# Patient Record
Sex: Female | Born: 1965 | Race: Black or African American | Hispanic: No | Marital: Married
Health system: Southern US, Community
[De-identification: ages and names within clinical notes are randomized; demographics above are authoritative.]

## PROBLEM LIST (undated history)

## (undated) DIAGNOSIS — I10 Essential (primary) hypertension: Secondary | ICD-10-CM

## (undated) DIAGNOSIS — M1990 Unspecified osteoarthritis, unspecified site: Secondary | ICD-10-CM

## (undated) DIAGNOSIS — J45909 Unspecified asthma, uncomplicated: Secondary | ICD-10-CM

## (undated) HISTORY — PX: CHOLECYSTECTOMY: SHX55

---

## 2001-03-13 ENCOUNTER — Emergency Department (HOSPITAL_COMMUNITY): Admission: EM | Admit: 2001-03-13 | Discharge: 2001-03-13 | Payer: Self-pay | Admitting: *Deleted

## 2001-06-05 ENCOUNTER — Emergency Department (HOSPITAL_COMMUNITY): Admission: EM | Admit: 2001-06-05 | Discharge: 2001-06-05 | Payer: Self-pay | Admitting: Emergency Medicine

## 2005-11-09 ENCOUNTER — Emergency Department: Payer: Self-pay | Admitting: Emergency Medicine

## 2008-07-17 ENCOUNTER — Emergency Department: Payer: Self-pay | Admitting: Emergency Medicine

## 2008-10-21 ENCOUNTER — Emergency Department: Payer: Self-pay | Admitting: Emergency Medicine

## 2009-06-22 ENCOUNTER — Emergency Department: Payer: Self-pay | Admitting: Emergency Medicine

## 2009-10-01 ENCOUNTER — Emergency Department: Payer: Self-pay | Admitting: Emergency Medicine

## 2010-09-20 ENCOUNTER — Emergency Department: Payer: Self-pay | Admitting: Emergency Medicine

## 2010-12-20 ENCOUNTER — Emergency Department: Payer: Self-pay | Admitting: Emergency Medicine

## 2011-02-28 ENCOUNTER — Emergency Department: Payer: Self-pay | Admitting: Emergency Medicine

## 2011-11-30 ENCOUNTER — Emergency Department: Payer: Self-pay | Admitting: Emergency Medicine

## 2012-02-07 ENCOUNTER — Emergency Department: Payer: Self-pay | Admitting: Emergency Medicine

## 2012-04-18 ENCOUNTER — Emergency Department: Payer: Self-pay | Admitting: Emergency Medicine

## 2012-04-18 LAB — TROPONIN I: Troponin-I: <0.02 ng/mL

## 2012-04-27 ENCOUNTER — Emergency Department: Payer: Self-pay | Admitting: Emergency Medicine

## 2013-04-02 ENCOUNTER — Emergency Department: Payer: Self-pay | Admitting: Internal Medicine

## 2013-07-13 ENCOUNTER — Emergency Department: Payer: Self-pay | Admitting: Internal Medicine

## 2013-08-26 ENCOUNTER — Emergency Department: Payer: Self-pay | Admitting: Emergency Medicine

## 2013-08-26 LAB — COMPREHENSIVE METABOLIC PANEL
Anion Gap: 3 — ABNORMAL LOW (ref 7–16)
Calcium, Total: 8.8 mg/dL (ref 8.5–10.1)
Co2: 31 mmol/L (ref 21–32)
EGFR (Non-African Amer.): >60
Osmolality: 273 (ref 275–301)
Potassium: 3.3 mmol/L — ABNORMAL LOW (ref 3.5–5.1)
SGOT(AST): 35 U/L (ref 15–37)
Sodium: 136 mmol/L (ref 136–145)
Total Protein: 7.7 g/dL (ref 6.4–8.2)

## 2013-08-26 LAB — CBC
HCT: 45.4 % (ref 35.0–47.0)
HGB: 14.8 g/dL (ref 12.0–16.0)
MCHC: 32.6 g/dL (ref 32.0–36.0)
MCV: 86 fL (ref 80–100)
Platelet: 247 10*3/uL (ref 150–440)
RDW: 13.6 % (ref 11.5–14.5)
WBC: 8.4 10*3/uL (ref 3.6–11.0)

## 2013-08-26 LAB — TROPONIN I: Troponin-I: <0.02 ng/mL

## 2013-08-26 LAB — CK TOTAL AND CKMB (NOT AT ARMC): CK-MB: 6.9 ng/mL — ABNORMAL HIGH (ref 0.5–3.6)

## 2014-02-18 ENCOUNTER — Emergency Department: Payer: Self-pay | Admitting: Emergency Medicine

## 2014-04-06 ENCOUNTER — Emergency Department: Payer: Self-pay | Admitting: Emergency Medicine

## 2014-06-09 ENCOUNTER — Emergency Department: Payer: Self-pay | Admitting: Emergency Medicine

## 2014-07-02 ENCOUNTER — Emergency Department: Payer: Self-pay | Admitting: Emergency Medicine

## 2014-10-07 ENCOUNTER — Emergency Department: Payer: Self-pay | Admitting: Emergency Medicine

## 2014-10-18 ENCOUNTER — Emergency Department: Payer: Self-pay | Admitting: Emergency Medicine

## 2014-10-18 LAB — CBC
HCT: 45.8 % (ref 35.0–47.0)
HGB: 14.7 g/dL (ref 12.0–16.0)
MCH: 28 pg (ref 26.0–34.0)
MCHC: 32.2 g/dL (ref 32.0–36.0)
MCV: 87 fL (ref 80–100)
Platelet: 250 10*3/uL (ref 150–440)
RBC: 5.26 10*6/uL — ABNORMAL HIGH (ref 3.80–5.20)
RDW: 13.5 % (ref 11.5–14.5)
WBC: 7.2 10*3/uL (ref 3.6–11.0)

## 2014-10-18 LAB — BASIC METABOLIC PANEL
Anion Gap: 6 — ABNORMAL LOW (ref 7–16)
BUN: 16 mg/dL (ref 7–18)
CALCIUM: 8.6 mg/dL (ref 8.5–10.1)
CO2: 31 mmol/L (ref 21–32)
Chloride: 104 mmol/L (ref 98–107)
Creatinine: 0.74 mg/dL (ref 0.60–1.30)
EGFR (African American): >60
EGFR (Non-African Amer.): >60
Glucose: 109 mg/dL — ABNORMAL HIGH (ref 65–99)
Osmolality: 283 (ref 275–301)
Potassium: 3.6 mmol/L (ref 3.5–5.1)
SODIUM: 141 mmol/L (ref 136–145)

## 2014-10-19 LAB — PRO B NATRIURETIC PEPTIDE: B-Type Natriuretic Peptide: 34 pg/mL (ref 0–125)

## 2014-10-19 LAB — TROPONIN I
Troponin-I: <0.02 ng/mL
Troponin-I: <0.02 ng/mL

## 2014-12-14 ENCOUNTER — Emergency Department: Payer: Self-pay | Admitting: Emergency Medicine

## 2014-12-14 LAB — URINALYSIS, COMPLETE
Bacteria: NONE SEEN
Bilirubin,UR: NEGATIVE
Glucose,UR: NEGATIVE mg/dL (ref 0–75)
KETONE: NEGATIVE
LEUKOCYTE ESTERASE: NEGATIVE
Nitrite: NEGATIVE
PH: 6 (ref 4.5–8.0)
Protein: NEGATIVE
Specific Gravity: 1.01 (ref 1.003–1.030)
Squamous Epithelial: 2

## 2014-12-14 LAB — CBC
HCT: 44.4 % (ref 35.0–47.0)
HGB: 14.3 g/dL (ref 12.0–16.0)
MCH: 27.6 pg (ref 26.0–34.0)
MCHC: 32.2 g/dL (ref 32.0–36.0)
MCV: 86 fL (ref 80–100)
Platelet: 230 10*3/uL (ref 150–440)
RBC: 5.18 10*6/uL (ref 3.80–5.20)
RDW: 14 % (ref 11.5–14.5)
WBC: 6.5 10*3/uL (ref 3.6–11.0)

## 2014-12-14 LAB — BASIC METABOLIC PANEL
Anion Gap: 3 — ABNORMAL LOW (ref 7–16)
BUN: 14 mg/dL (ref 7–18)
CALCIUM: 8.5 mg/dL (ref 8.5–10.1)
CREATININE: 0.77 mg/dL (ref 0.60–1.30)
Chloride: 105 mmol/L (ref 98–107)
Co2: 32 mmol/L (ref 21–32)
GLUCOSE: 93 mg/dL (ref 65–99)
Osmolality: 280 (ref 275–301)
Potassium: 3.9 mmol/L (ref 3.5–5.1)
Sodium: 140 mmol/L (ref 136–145)

## 2014-12-14 LAB — TROPONIN I: Troponin-I: <0.02 ng/mL

## 2014-12-31 ENCOUNTER — Emergency Department: Payer: Self-pay | Admitting: Emergency Medicine

## 2015-01-11 ENCOUNTER — Emergency Department: Payer: Self-pay | Admitting: Emergency Medicine

## 2015-03-04 ENCOUNTER — Emergency Department
Admit: 2015-03-04 | Disposition: A | Discharge status: Home / Self Care | Payer: Self-pay | Admitting: Emergency Medicine

## 2015-03-06 ENCOUNTER — Emergency Department
Admit: 2015-03-06 | Disposition: A | Discharge status: Home / Self Care | Payer: Self-pay | Admitting: Emergency Medicine

## 2015-04-14 ENCOUNTER — Encounter: Payer: Self-pay | Admitting: Emergency Medicine

## 2015-04-14 ENCOUNTER — Emergency Department
Admission: EM | Admit: 2015-04-14 | Discharge: 2015-04-14 | Disposition: A | Discharge status: Home / Self Care | Payer: BLUE CROSS/BLUE SHIELD | Attending: Emergency Medicine | Admitting: Emergency Medicine

## 2015-04-14 DIAGNOSIS — Z760 Encounter for issue of repeat prescription: Secondary | ICD-10-CM | POA: Insufficient documentation

## 2015-04-14 DIAGNOSIS — I10 Essential (primary) hypertension: Secondary | ICD-10-CM | POA: Diagnosis not present

## 2015-04-14 DIAGNOSIS — Z79899 Other long term (current) drug therapy: Secondary | ICD-10-CM | POA: Diagnosis not present

## 2015-04-14 HISTORY — DX: Essential (primary) hypertension: I10

## 2015-04-14 MED ORDER — HYDROCHLOROTHIAZIDE 12.5 MG PO CAPS: 12.5000 mg | ORAL_CAPSULE | Freq: Every day | ORAL | Status: DC
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
## 2015-04-14 NOTE — ED Notes (Signed)
Also states she would like to have her hctz refilled..had an episode of elevated b/p today

## 2015-04-14 NOTE — Discharge Instructions (Signed)
Hypertension °Hypertension, commonly called high blood pressure, is when the force of blood pumping through your arteries is too strong. Your arteries are the blood vessels that carry blood from your heart throughout your body. A blood pressure reading consists of a higher number over a lower number, such as 110/72. The higher number (systolic) is the pressure inside your arteries when your heart pumps. The lower number (diastolic) is the pressure inside your arteries when your heart relaxes. Ideally you want your blood pressure below 120/80. °Hypertension forces your heart to work harder to pump blood. Your arteries may become narrow or stiff. Having hypertension puts you at risk for heart disease, stroke, and other problems.  °RISK FACTORS °Some risk factors for high blood pressure are controllable. Others are not.  °Risk factors you cannot control include:  °· Race. You may be at higher risk if you are African American. °· Age. Risk increases with age. °· Gender. Men are at higher risk than women before age 45 years. After age 65, women are at higher risk than men. °Risk factors you can control include: °· Not getting enough exercise or physical activity. °· Being overweight. °· Getting too much fat, sugar, calories, or salt in your diet. °· Drinking too much alcohol. °SIGNS AND SYMPTOMS °Hypertension does not usually cause signs or symptoms. Extremely high blood pressure (hypertensive crisis) may cause headache, anxiety, shortness of breath, and nosebleed. °DIAGNOSIS  °To check if you have hypertension, your health care provider will measure your blood pressure while you are seated, with your arm held at the level of your heart. It should be measured at least twice using the same arm. Certain conditions can cause a difference in blood pressure between your right and left arms. A blood pressure reading that is higher than normal on one occasion does not mean that you need treatment. If one blood pressure reading  is high, ask your health care provider about having it checked again. °TREATMENT  °Treating high blood pressure includes making lifestyle changes and possibly taking medicine. Living a healthy lifestyle can help lower high blood pressure. You may need to change some of your habits. °Lifestyle changes may include: °· Following the DASH diet. This diet is high in fruits, vegetables, and whole grains. It is low in salt, red meat, and added sugars. °· Getting at least 2½ hours of brisk physical activity every week. °· Losing weight if necessary. °· Not smoking. °· Limiting alcoholic beverages. °· Learning ways to reduce stress. ° If lifestyle changes are not enough to get your blood pressure under control, your health care provider may prescribe medicine. You may need to take more than one. Work closely with your health care provider to understand the risks and benefits. °HOME CARE INSTRUCTIONS °· Have your blood pressure rechecked as directed by your health care provider.   °· Take medicines only as directed by your health care provider. Follow the directions carefully. Blood pressure medicines must be taken as prescribed. The medicine does not work as well when you skip doses. Skipping doses also puts you at risk for problems.   °· Do not smoke.   °· Monitor your blood pressure at home as directed by your health care provider.  °SEEK MEDICAL CARE IF:  °· You think you are having a reaction to medicines taken. °· You have recurrent headaches or feel dizzy. °· You have swelling in your ankles. °· You have trouble with your vision. °SEEK IMMEDIATE MEDICAL CARE IF: °· You develop a severe headache or confusion. °·   You have unusual weakness, numbness, or feel faint.  You have severe chest or abdominal pain.  You vomit repeatedly.  You have trouble breathing. MAKE SURE YOU:   Understand these instructions.  Will watch your condition.  Will get help right away if you are not doing well or get worse. Document  Released: 10/24/2005 Document Revised: 03/10/2014 Document Reviewed: 08/16/2013 [facility] Patient Information 2015 [place]. This information is not intended to replace advice given to you by your health care provider. Make sure you discuss any questions you have with your health care provider.  Take the prescription med for blood pressure as directed.  You must call your insurance company to find a provider for ongoing medical care.  Consider Dr. Darrick Huntsmanullo for your primary care needs.

## 2015-04-14 NOTE — ED Provider Notes (Signed)
[facility] Emergency Department Provider Note ____________________________________________  Time seen: 1748  I have reviewed the triage vital signs and the nursing notes.  HISTORY  Chief Complaint Hypertension  HPI Sydney Gilmore is a 49 y.o. female well-known to the ED, who reports today for complaints of elevated blood pressure. She describes that she was at the local drugstore checking her blood pressure and noted to be elevated at about 170 systolic and 90 diastolic. She also reports being out of a previously prescribed HCTZ prescription, stating that she had not been able to seek referral to her primary care provider for continuation of the medication. She is here today requesting a refill of the blood pressure medicine at this time. She denies any headache, blurred vision, dizziness, syncope or chest pain. She did report some right thigh limited to standing at work which is somewhat improved. She denies any recent injury or trauma as well.  Past Medical History  Diagnosis Date  . Hypertension    There are no active problems to display for this patient.  History reviewed. No pertinent past surgical history.  Current Outpatient Rx  Name  Route  Sig  Dispense  Refill  . hydrochlorothiazide (MICROZIDE) 12.5 MG capsule   Oral   Take 12.5 mg by mouth daily.         . hydrochlorothiazide (MICROZIDE) 12.5 MG capsule   Oral   Take 1 capsule (12.5 mg total) by mouth daily.   30 capsule   0    Allergies Review of patient's allergies indicates no known allergies.  Family History  Problem Relation Age of Onset  . Heart failure Father    Social History History  Substance Use Topics  . Smoking status: Never Smoker   . Smokeless tobacco: Not on file  . Alcohol Use: No   Review of Systems  Constitutional: Negative for fever. Eyes: Negative for visual changes. ENT: Negative for sore throat. Cardiovascular: Negative for chest pain. Elevated BP.   Respiratory: Negative for shortness of breath. Gastrointestinal: Negative for abdominal pain, vomiting and diarrhea. Genitourinary: Negative for dysuria. Musculoskeletal: Negative for back pain. Skin: Negative for rash. Neurological: Negative for headaches, focal weakness or numbness. ____________________________________________  PHYSICAL EXAM:  VITAL SIGNS: ED Triage Vitals  Enc Vitals Group     BP 04/14/15 1630 140/91 mmHg     Pulse Rate 04/14/15 1630 92     Resp 04/14/15 1630 18     Temp 04/14/15 1630 98.4 F (36.9 C)     Temp Source 04/14/15 1630 Oral     SpO2 04/14/15 1630 100 %     Weight 04/14/15 1630 232 lb (105.235 kg)     Height 04/14/15 1630 5\' 6"  (1.676 m)     Head Cir --      Peak Flow --      Pain Score 04/14/15 1630 0     Pain Loc --      Pain Edu? --      Excl. in GC? --    Constitutional: Alert and oriented. Well appearing and in no distress. Eyes: Conjunctivae are normal. PERRL. Normal extraocular movements. ENT   Head: Normocephalic and atraumatic.   Nose: No congestion/rhinnorhea.   Mouth/Throat: Mucous membranes are moist.   Neck: No stridor. Hematological/Lymphatic/Immunilogical: No cervical lymphadenopathy. Cardiovascular: Normal rate, regular rhythm.  Respiratory: Normal respiratory effort. No wheezes/rales/rhonchi. Gastrointestinal: Soft and nontender. No distention. Musculoskeletal: Nontender with normal range of motion in all extremities. No lower extremity tenderness nor edema.  Neurologic:  Normal speech and language. No gross focal neurologic deficits are appreciated. CN II-XII grossly intact. Skin:  Skin is warm, dry and intact. No rash noted. Psychiatric: Mood and affect are normal. Patient exhibits appropriate insight and judgment. ____________________________________________   INITIAL IMPRESSION / ASSESSMENT AND PLAN / ED COURSE  Essential hypertension with previous medication therapy. Patient advised that she must see  primary care with a provider approved by her newly obtained medical insurance carrier.  As a courtesy, we will provider a 30-day supply of her HCTZ. She verbalized understanding and will call the number on the back of her insurance card for advice.  Work note for tonight's second shift absence given as requested.  ____________________________________________  FINAL CLINICAL IMPRESSION(S) / ED DIAGNOSES  Final diagnoses:  Essential hypertension  Encounter for medication refill      Lissa Hoard, PA-C 04/14/15 2355  Sharyn Creamer, MD 04/14/15 2358

## 2015-04-27 ENCOUNTER — Emergency Department
Admission: EM | Admit: 2015-04-27 | Discharge: 2015-04-27 | Disposition: A | Discharge status: Home / Self Care | Payer: BLUE CROSS/BLUE SHIELD | Attending: Emergency Medicine | Admitting: Emergency Medicine

## 2015-04-27 ENCOUNTER — Emergency Department: Payer: BLUE CROSS/BLUE SHIELD

## 2015-04-27 ENCOUNTER — Encounter: Payer: Self-pay | Admitting: *Deleted

## 2015-04-27 DIAGNOSIS — M79604 Pain in right leg: Secondary | ICD-10-CM | POA: Diagnosis present

## 2015-04-27 DIAGNOSIS — Z79899 Other long term (current) drug therapy: Secondary | ICD-10-CM | POA: Insufficient documentation

## 2015-04-27 DIAGNOSIS — G5711 Meralgia paresthetica, right lower limb: Secondary | ICD-10-CM | POA: Diagnosis not present

## 2015-04-27 DIAGNOSIS — I10 Essential (primary) hypertension: Secondary | ICD-10-CM | POA: Diagnosis not present

## 2015-04-27 NOTE — ED Notes (Signed)
Right upper leg pain that began today upon awakening. Color WNL. Pt ambulatory.

## 2015-04-27 NOTE — Discharge Instructions (Signed)
No serious illness or injury suspected. I reviewed pain is either coming from muscle pain, or pinched superficial nerve called meralgia paresthetica. Wearing looser pain around the waistband. Limit standing, bending, and stooping as needed and tell symptoms resolved about 1 week. Return to the emergency department for any new or worsening condition including any leg swelling, leg rash, nor worsening pain, chest pain, shortness breath, or trouble breathing.  Pinched Nerve The term pinched nerve describes one type of damage or injury to a nerve or set of nerves. Pinched nerves can sometimes lead to other conditions. These include peripheral neuropathy, carpal tunnel syndrome, and tennis elbow. The extent of such injuries may vary from minor, temporary damage to a more permanent condition. Early diagnosis is important to prevent further damage or complications. Pinched nerve is a common cause of on-the-job injury. CAUSES  The injury may result from:  Compression.  Constriction.  Stretching. SYMPTOMS  Symptoms include:  Numbness.  "Pins and needles" or burning sensations.  Pain radiating outward from the injured area.  One of the most common examples of a single compressed nerve is the feeling of having a foot or hand "fall asleep." TREATMENT  The most often recommended treatment for pinched nerve is rest for the affected area. Corticosteroids help alleviate pain. In some cases, surgery is recommended. Physical therapy may be recommended. Splints or collars may be used. With treatment, most people recover from pinched nerve. In some cases, the damage is irreversible. Document Released: 10/14/2002 Document Revised: 01/16/2012 Document Reviewed: 10/01/2008 [facility] Patient Information 2015 [place], [place]. This information is not intended to replace advice given to you by your health care provider. Make sure you discuss any questions you have with your health care provider.

## 2015-04-27 NOTE — Progress Notes (Signed)
   04/27/15 1627  Clinical Encounter Type  Visited With Patient  Visit Type Initial  Spiritual Encounters  Spiritual Needs Prayer  Stress Factors  Patient Stress Factors Health changes   Faith tradition: [employer] Status: alert oriented sitting bedside Family: 1 daughter 2 sons neither present, all in highschool Visit Assessment: Chaplain visited the patient. She complained of right leg pain. She said that she was worried about getting a dr's note for work [employer]); She shared that she was on the job for 29 yrs and may have been hurt at work. She said it was pleasant to have the chaplain visit.  Chaplains and Pastoral Care can be reached at [phone] or by submitting an online request ([phone])

## 2015-04-27 NOTE — ED Notes (Signed)
Pt alert and oriented X4, active, cooperative, pt in NAD. RR even and unlabored, color WNL.  Pt informed to return if any life threatening symptoms occur.   

## 2015-04-27 NOTE — ED Provider Notes (Signed)
[facility] Emergency Department Provider Note   ____________________________________________  Time seen: 4:15 PM I have reviewed the triage vital signs and the triage nursing note.  HISTORY  Chief Complaint Leg Pain   Historian Patient  HPI Sydney Gilmore is a 49 y.o. female who is presenting with several days of right leg pain which is located in the anterior thigh and feels like burning. She also has some lateral and posterior thigh pain which is more squeezing. She reports she's had a superficial blood clot in a vein behind her knee. She's had no . She's had no skin rash. She's had no joint pain. She's had no chest pain or trouble breathing. She's had no family history of blood clots. She is especially concerned about a leg blood clot. She does work 7 days a week standing and bending.   Past Medical History  Diagnosis Date  . Hypertension     There are no active problems to display for this patient.   History reviewed. No pertinent past surgical history.  Current Outpatient Rx  Name  Route  Sig  Dispense  Refill  . hydrochlorothiazide (MICROZIDE) 12.5 MG capsule   Oral   Take 12.5 mg by mouth daily.         . hydrochlorothiazide (MICROZIDE) 12.5 MG capsule   Oral   Take 1 capsule (12.5 mg total) by mouth daily.   30 capsule   0     Allergies Review of patient's allergies indicates no known allergies.  Family History  Problem Relation Age of Onset  . Heart failure Father     Social History History  Substance Use Topics  . Smoking status: Never Smoker   . Smokeless tobacco: Not on file  . Alcohol Use: No    Review of Systems  Constitutional: Negative for fever. Eyes: Negative for visual changes. ENT: Negative for sore throat. Cardiovascular: Negative for chest pain. Respiratory: Negative for shortness of breath. Gastrointestinal: Negative for abdominal pain, vomiting and diarrhea. Genitourinary: Negative for  dysuria. Musculoskeletal: Negative for back pain. Skin: Negative for rash. Neurological: Negative for headaches, focal weakness or numbness.  ____________________________________________   PHYSICAL EXAM:  VITAL SIGNS: ED Triage Vitals  Enc Vitals Group     BP 04/27/15 1559 123/90 mmHg     Pulse Rate 04/27/15 1557 90     Resp 04/27/15 1557 20     Temp 04/27/15 1557 98.1 F (36.7 C)     Temp Source 04/27/15 1557 Oral     SpO2 04/27/15 1557 98 %     Weight 04/27/15 1557 232 lb (105.235 kg)     Height 04/27/15 1557 5\' 6"  (1.676 m)     Head Cir --      Peak Flow --      Pain Score 04/27/15 1555 7     Pain Loc --      Pain Edu? --      Excl. in GC? --      Constitutional: Alert and oriented. Well appearing and in no distress. Eyes: Conjunctivae are normal. PERRL. Normal extraocular movements. ENT   Head: Normocephalic and atraumatic.   Nose: No congestion/rhinnorhea.   Mouth/Throat: Mucous membranes are moist.   Neck: No stridor. Cardiovascular: Normal rate, regular rhythm.  No murmurs, rubs, or gallops. Respiratory: Normal respiratory effort without tachypnea nor retractions. Breath sounds are clear and equal bilaterally. No wheezes/rales/rhonchi. Gastrointestinal: Soft. No distention, no guarding, no rebound. Nontender  Genitourinary/rectal: Deferred Musculoskeletal: Nontender with normal range  of motion in all extremities. Reports tenderness or paresthesias over the anterolateral portion of the right thigh. Reports mild tenderness to palpation of the lateral posterior thigh. No joint effusions.  No edema edema. Neurologic:  Normal speech and language. No gross focal neurologic deficits are appreciated. Skin:  Skin is warm, dry and intact. No rash noted. Psychiatric: Mood and affect are normal. Speech and behavior are normal. Patient exhibits appropriate insight and judgment.  ____________________________________________   EKG I, Governor Rooks, MD, the  attending physician have personally viewed and interpreted all ECGs.  None ____________________________________________  LABS (pertinent positives/negatives)  None  ____________________________________________  RADIOLOGY Imaging viewed by me, and interpreted by Radiologist   Ultrasound right lower extremity: No DVT __________________________________________  PROCEDURES  Procedure(s) performed: None Critical Care performed: None  ____________________________________________   ED COURSE / ASSESSMENT AND PLAN  Pertinent labs & imaging results that were available during my care of the patient were reviewed by me and considered in my medical decision making (see chart for details).  Symptoms most consistent with either musculoskeletal pain of the right thigh or IT band, and or meralgia paresthetica of the right side. I discussed this with the patient. She is extremely concerned about possibly of a DVT, and with some posterior discomfort including in the back of the knee, I will obtain an ultrasound to rule out any DVT.    I discussed return precautions, follow-up instructions, and discharged instructions with patient and/or family.  ___________________________________________   FINAL CLINICAL IMPRESSION(S) / ED DIAGNOSES   Final diagnoses:  Meralgia paraesthetica, right      Governor Rooks, MD 04/27/15 [phone]

## 2015-05-23 ENCOUNTER — Emergency Department
Admission: EM | Admit: 2015-05-23 | Discharge: 2015-05-23 | Disposition: A | Discharge status: Home / Self Care | Payer: BLUE CROSS/BLUE SHIELD | Attending: Emergency Medicine | Admitting: Emergency Medicine

## 2015-05-23 ENCOUNTER — Encounter: Payer: Self-pay | Admitting: Emergency Medicine

## 2015-05-23 DIAGNOSIS — R202 Paresthesia of skin: Secondary | ICD-10-CM | POA: Insufficient documentation

## 2015-05-23 DIAGNOSIS — Z79899 Other long term (current) drug therapy: Secondary | ICD-10-CM | POA: Insufficient documentation

## 2015-05-23 DIAGNOSIS — I10 Essential (primary) hypertension: Secondary | ICD-10-CM | POA: Insufficient documentation

## 2015-05-23 DIAGNOSIS — M79604 Pain in right leg: Secondary | ICD-10-CM | POA: Diagnosis present

## 2015-05-23 MED ORDER — MELOXICAM 15 MG PO TABS: 15.0000 mg | ORAL_TABLET | Freq: Every day | ORAL | Status: DC

## 2015-05-23 MED ORDER — PREDNISONE 10 MG (21) PO TBPK: ORAL_TABLET | ORAL | Status: DC

## 2015-05-23 NOTE — ED Notes (Signed)
States burning thigh pain x 1 month, low back pain intermittent, bilateral pedal edema however equal and states this is usual for her.

## 2015-05-23 NOTE — ED Provider Notes (Signed)
[facility] Emergency Department Provider Note ____________________________________________  Time seen: Approximately 5:06 PM  I have reviewed the triage vital signs and the nursing notes.   HISTORY  Chief Complaint Leg Pain   HPI Sydney Gilmore is a 49 y.o. female who presents to the emergency department for sudden onset of numbness and burning in the right thigh that has been present for approximately one month. She states that she works to 7 days a week and walks on concrete floors for many hours during the day. She was evaluated for the same pain on 04/27/2015 and ruled out for DVT. She has not improved with BC powders, ibuprofen, or Tylenol.  Past Medical History  Diagnosis Date  . Hypertension     There are no active problems to display for this patient.   Past Surgical History  Procedure Laterality Date  . Cholecystectomy      Current Outpatient Rx  Name  Route  Sig  Dispense  Refill  . hydrochlorothiazide (MICROZIDE) 12.5 MG capsule   Oral   Take 12.5 mg by mouth daily.         . hydrochlorothiazide (MICROZIDE) 12.5 MG capsule   Oral   Take 1 capsule (12.5 mg total) by mouth daily.   30 capsule   0   . meloxicam (MOBIC) 15 MG tablet   Oral   Take 1 tablet (15 mg total) by mouth daily.   30 tablet   2   . predniSONE (STERAPRED UNI-PAK 21 TAB) 10 MG (21) TBPK tablet      Take 6 tablets on day 1 Take 5 tablets on day 2 Take 4 tablets on day 3 Take 3 tablets on day 4 Take 2 tablets on day 5 Take 1 tablet on day 6   21 tablet   0     Allergies Review of patient's allergies indicates no known allergies.  Family History  Problem Relation Age of Onset  . Heart failure Father     Social History History  Substance Use Topics  . Smoking status: Never Smoker   . Smokeless tobacco: Not on file  . Alcohol Use: No    Review of Systems Constitutional: No recent illness. Eyes: No visual changes. ENT: No sore  throat. Cardiovascular: Denies chest pain or palpitations. Respiratory: Denies shortness of breath. Gastrointestinal: No abdominal pain.  Genitourinary: Negative for dysuria. Musculoskeletal: Pain in right lateral and anterior thigh and transverse lumbar Skin: Negative for rash. Neurological: Negative for headaches, focal weakness or numbness. 10-point ROS otherwise negative.  ____________________________________________   PHYSICAL EXAM:  VITAL SIGNS: ED Triage Vitals  Enc Vitals Group     BP 05/23/15 1641 140/81 mmHg     Pulse Rate 05/23/15 1641 92     Resp 05/23/15 1641 18     Temp 05/23/15 1641 98.4 F (36.9 C)     Temp Source 05/23/15 1641 Oral     SpO2 05/23/15 1641 97 %     Weight 05/23/15 1641 232 lb (105.235 kg)     Height 05/23/15 1641 5\' 6"  (1.676 m)     Head Cir --      Peak Flow --      Pain Score 05/23/15 1642 8     Pain Loc --      Pain Edu? --      Excl. in GC? --     Constitutional: Alert and oriented. Well appearing and in no acute distress. Eyes: Conjunctivae are normal. EOMI. Head: Atraumatic.  Nose: No congestion/rhinnorhea. Neck: No stridor.  Respiratory: Normal respiratory effort.   Musculoskeletal: Full range of motion of the right lower extremity including right hip. No pain with external or internal rotation. Ambulatory without assistance. Neurologic:  Normal speech and language. No gross focal neurologic deficits are appreciated. Speech is normal. No gait instability. Skin:  Skin is warm, dry and intact. Atraumatic. No contusions or lesions present over the area of numbness. Psychiatric: Mood and affect are normal. Speech and behavior are normal.  ____________________________________________   LABS (all labs ordered are listed, but only abnormal results are displayed)  Labs Reviewed - No data to display ____________________________________________  RADIOLOGY   ____________________________________________   PROCEDURES  Procedure(s)  performed: None   ____________________________________________   INITIAL IMPRESSION / ASSESSMENT AND PLAN / ED COURSE  Pertinent labs & imaging results that were available during my care of the patient were reviewed by me and considered in my medical decision making (see chart for details).  Due to the length of paresthesia will try a seven-day course of prednisone in tapering dose and prescribe meloxicam. Patient was strongly advised to establish a primary care provider and have a physical including fasting labs. She was advised to return to the emergency department for any shortness of breath or pain that extends to the calf or hip. ____________________________________________   FINAL CLINICAL IMPRESSION(S) / ED DIAGNOSES  Final diagnoses:  Paresthesia of right leg       Chinita Pester, FNP 05/23/15 1728  Loleta Rose, MD 05/23/15 2207

## 2015-05-23 NOTE — Discharge Instructions (Signed)
Patient was advised to follow up with Primary Care for symptoms that are not improving over the next 7 days. Also advised to return to the ER for symptoms that change or worsen if unable to schedule an appointment.

## 2015-05-25 ENCOUNTER — Encounter: Payer: Self-pay | Admitting: Emergency Medicine

## 2015-05-25 ENCOUNTER — Emergency Department
Admission: EM | Admit: 2015-05-25 | Discharge: 2015-05-25 | Disposition: A | Discharge status: Home / Self Care | Payer: BLUE CROSS/BLUE SHIELD | Attending: Emergency Medicine | Admitting: Emergency Medicine

## 2015-05-25 DIAGNOSIS — R202 Paresthesia of skin: Secondary | ICD-10-CM | POA: Insufficient documentation

## 2015-05-25 DIAGNOSIS — Z79899 Other long term (current) drug therapy: Secondary | ICD-10-CM | POA: Diagnosis not present

## 2015-05-25 DIAGNOSIS — I10 Essential (primary) hypertension: Secondary | ICD-10-CM | POA: Diagnosis not present

## 2015-05-25 DIAGNOSIS — Z760 Encounter for issue of repeat prescription: Secondary | ICD-10-CM | POA: Diagnosis not present

## 2015-05-25 DIAGNOSIS — M25551 Pain in right hip: Secondary | ICD-10-CM | POA: Diagnosis present

## 2015-05-25 MED ORDER — HYDROCHLOROTHIAZIDE 12.5 MG PO CAPS
12.5000 mg | ORAL_CAPSULE | Freq: Once | ORAL | Status: AC
  Administered 2015-05-25: 12.5 mg via ORAL
  Filled 2015-05-25: qty 1

## 2015-05-25 MED ORDER — HYDROCHLOROTHIAZIDE 12.5 MG PO CAPS: 12.5000 mg | ORAL_CAPSULE | Freq: Every day | ORAL | Status: DC

## 2015-05-25 NOTE — ED Notes (Signed)
Pt was seen here on Saturday for right hip pain, dx with sciatica and given meds and day off from work. Pt wants rechecked and one more day out of work. No acute distress noted.

## 2015-05-25 NOTE — ED Notes (Signed)
Assessment per PA 

## 2015-05-25 NOTE — ED Provider Notes (Signed)
[facility] Emergency Department Provider Note  ____________________________________________  Time seen: Approximately 4:57 PM  I have reviewed the triage vital signs and the nursing notes.   HISTORY  Chief Complaint Leg Pain    HPI Sydney Gilmore is a 49 y.o. female patient here for reevaluation and return back to work secondary to being seen for right hip pain 2 days ago. Patient was diagnosed with sciatica and given prednisone dosepak along was meloxicam.Patient state the decreased activity has decreased her pain. Patient is rating the pain as a 6/10. Patient states she is out of her blood pressure medication when she was questioned about her elevated reading today. It was noticed on the last 2 previous visit her blood pressure was controlled.   Past Medical History  Diagnosis Date  . Hypertension     There are no active problems to display for this patient.   Past Surgical History  Procedure Laterality Date  . Cholecystectomy      Current Outpatient Rx  Name  Route  Sig  Dispense  Refill  . hydrochlorothiazide (MICROZIDE) 12.5 MG capsule   Oral   Take 12.5 mg by mouth daily.         . hydrochlorothiazide (MICROZIDE) 12.5 MG capsule   Oral   Take 1 capsule (12.5 mg total) by mouth daily.   30 capsule   0   . hydrochlorothiazide (MICROZIDE) 12.5 MG capsule   Oral   Take 1 capsule (12.5 mg total) by mouth daily.   30 capsule   11   . meloxicam (MOBIC) 15 MG tablet   Oral   Take 1 tablet (15 mg total) by mouth daily.   30 tablet   2   . predniSONE (STERAPRED UNI-PAK 21 TAB) 10 MG (21) TBPK tablet      Take 6 tablets on day 1 Take 5 tablets on day 2 Take 4 tablets on day 3 Take 3 tablets on day 4 Take 2 tablets on day 5 Take 1 tablet on day 6   21 tablet   0     Allergies Review of patient's allergies indicates no known allergies.  Family History  Problem Relation Age of Onset  . Heart failure Father     Social  History History  Substance Use Topics  . Smoking status: Never Smoker   . Smokeless tobacco: Not on file  . Alcohol Use: No    Review of Systems Constitutional: No fever/chills Eyes: No visual changes. ENT: No sore throat. Cardiovascular: Denies chest pain. Respiratory: Denies shortness of breath. Gastrointestinal: No abdominal pain.  No nausea, no vomiting.  No diarrhea.  No constipation. Genitourinary: Right hip pain Musculoskeletal: Negative for back pain. Skin: Negative for rash. Neurological: Negative for headaches, focal weakness or numbness. Endocrine: Hematological/Lymphatic: Allergic/Immunilogical: **} 10-point ROS otherwise negative.  ____________________________________________   PHYSICAL EXAM:  VITAL SIGNS: ED Triage Vitals  Enc Vitals Group     BP 05/25/15 1640 182/102 mmHg     Pulse Rate 05/25/15 1640 98     Resp 05/25/15 1640 18     Temp 05/25/15 1640 98.5 F (36.9 C)     Temp Source 05/25/15 1640 Oral     SpO2 05/25/15 1640 96 %     Weight 05/25/15 1640 236 lb (107.049 kg)     Height 05/25/15 1640 5\' 6"  (1.676 m)     Head Cir --      Peak Flow --      Pain Score 05/25/15  1641 6     Pain Loc --      Pain Edu? --      Excl. in GC? --     Constitutional: Alert and oriented. Well appearing and in no acute distress. Eyes: Conjunctivae are normal. PERRL. EOMI. Head: Atraumatic. Nose: No congestion/rhinnorhea. Mouth/Throat: Mucous membranes are moist.  Oropharynx non-erythematous. Neck: No stridor.  No cervical spine tenderness to palpation. Hematological/Lymphatic/Immunilogical: No cervical lymphadenopathy. Cardiovascular: Normal rate, regular rhythm. Grossly normal heart sounds.  Good peripheral circulation. Elevated blood pressure. Respiratory: Normal respiratory effort.  No retractions. Lungs CTAB. Gastrointestinal: Soft and nontender. No distention. No abdominal bruits. No CVA tenderness. Musculoskeletal: No deformity of the right hip there  is still some mild guarding palpation at greater trochanter. There is no guarding with external and internal rotation of the hip. Patient is ambulating normal gait.  Neurologic:  Normal speech and language. No gross focal neurologic deficits are appreciated. No gait instability. Skin:  Skin is warm, dry and intact. No rash noted. Psychiatric: Mood and affect are normal. Speech and behavior are normal.  ____________________________________________   LABS (all labs ordered are listed, but only abnormal results are displayed)  Labs Reviewed - No data to display ____________________________________________  EKG   ____________________________________________  RADIOLOGY   ____________________________________________   PROCEDURES  Procedure(s) performed: None  Critical Care performed: No  ____________________________________________   INITIAL IMPRESSION / ASSESSMENT AND PLAN / ED COURSE  Pertinent labs & imaging results that were available during my care of the patient were reviewed by me and considered in my medical decision making (see chart for details). Resolving paresthesia and myalgia to the right lower extremity. Due to the patient's elevated blood pressure she was given 12.5 mg hydrochlorothiazide. Patient advised to continue her previous medications. Patient given a return to work for tomorrow. Patient advised follow her PCP. Patient advised to return to ER for condition worsens. ____________________________________________   FINAL CLINICAL IMPRESSION(S) / ED DIAGNOSES  Final diagnoses:  Right leg paresthesias  Medication refill      Joni Reining, PA-C 05/25/15 1719  Emily Filbert, MD 05/26/15 [phone]

## 2015-07-01 ENCOUNTER — Emergency Department
Admission: EM | Admit: 2015-07-01 | Discharge: 2015-07-01 | Disposition: A | Discharge status: Home / Self Care | Payer: BLUE CROSS/BLUE SHIELD | Attending: Emergency Medicine | Admitting: Emergency Medicine

## 2015-07-01 ENCOUNTER — Encounter: Payer: Self-pay | Admitting: Emergency Medicine

## 2015-07-01 DIAGNOSIS — S76911D Strain of unspecified muscles, fascia and tendons at thigh level, right thigh, subsequent encounter: Secondary | ICD-10-CM

## 2015-07-01 DIAGNOSIS — X58XXXD Exposure to other specified factors, subsequent encounter: Secondary | ICD-10-CM | POA: Diagnosis not present

## 2015-07-01 DIAGNOSIS — S79921D Unspecified injury of right thigh, subsequent encounter: Secondary | ICD-10-CM | POA: Diagnosis present

## 2015-07-01 DIAGNOSIS — I10 Essential (primary) hypertension: Secondary | ICD-10-CM | POA: Insufficient documentation

## 2015-07-01 MED ORDER — NAPROXEN 500 MG PO TBEC: 500.0000 mg | DELAYED_RELEASE_TABLET | Freq: Two times a day (BID) | ORAL | Status: DC

## 2015-07-01 MED ORDER — NAPROXEN 500 MG PO TABS
500.0000 mg | ORAL_TABLET | Freq: Once | ORAL | Status: AC
  Administered 2015-07-01: 500 mg via ORAL
  Filled 2015-07-01: qty 1

## 2015-07-01 MED ORDER — HYDROCHLOROTHIAZIDE 12.5 MG PO CAPS: 12.5000 mg | ORAL_CAPSULE | Freq: Every day | ORAL | Status: DC

## 2015-07-01 NOTE — ED Notes (Signed)
Having pain to right knee and leg  states this has been going on for about 2-3 months   Unsure of injury.Marland Kitchen

## 2015-07-01 NOTE — Discharge Instructions (Signed)
Hip Pain Your hip is the joint between your upper legs and your lower pelvis. The bones, cartilage, tendons, and muscles of your hip joint perform a lot of work each day supporting your body weight and allowing you to move around. Hip pain can range from a minor ache to severe pain in one or both of your hips. Pain may be felt on the inside of the hip joint near the groin, or the outside near the buttocks and upper thigh. You may have swelling or stiffness as well.  HOME CARE INSTRUCTIONS   Take medicines only as directed by your health care provider.  Apply ice to the injured area:  Put ice in a plastic bag.  Place a towel between your skin and the bag.  Leave the ice on for 15-20 minutes at a time, 3-4 times a day.  Keep your leg raised (elevated) when possible to lessen swelling.  Avoid activities that cause pain.  Follow specific exercises as directed by your health care provider.  Sleep with a pillow between your legs on your most comfortable side.  Record how often you have hip pain, the location of the pain, and what it feels like. SEEK MEDICAL CARE IF:   You are unable to put weight on your leg.  Your hip is red or swollen or very tender to touch.  Your pain or swelling continues or worsens after 1 week.  You have increasing difficulty walking.  You have a fever. SEEK IMMEDIATE MEDICAL CARE IF:   You have fallen.  You have a sudden increase in pain and swelling in your hip. MAKE SURE YOU:   Understand these instructions.  Will watch your condition.  Will get help right away if you are not doing well or get worse. Document Released: 04/13/2010 Document Revised: 03/10/2014 Document Reviewed: 06/20/2013 [facility] Patient Information 2015 [place], [place]. This information is not intended to replace advice given to you by your health care provider. Make sure you discuss any questions you have with your health care provider.  Muscle Strain A muscle strain (pulled  muscle) happens when a muscle is stretched beyond normal length. It happens when a sudden, violent force stretches your muscle too far. Usually, a few of the fibers in your muscle are torn. Muscle strain is common in athletes. Recovery usually takes 1-2 weeks. Complete healing takes 5-6 weeks.  HOME CARE   Follow the PRICE method of treatment to help your injury get better. Do this the first 2-3 days after the injury:  Protect. Protect the muscle to keep it from getting injured again.  Rest. Limit your activity and rest the injured body part.  Ice. Put ice in a plastic bag. Place a towel between your skin and the bag. Then, apply the ice and leave it on from 15-20 minutes each hour. After the third day, switch to moist heat packs.  Compression. Use a splint or elastic bandage on the injured area for comfort. Do not put it on too tightly.  Elevate. Keep the injured body part above the level of your heart.  Only take medicine as told by your doctor.  Warm up before doing exercise to prevent future muscle strains. GET HELP IF:   You have more pain or puffiness (swelling) in the injured area.  You feel numbness, tingling, or notice a loss of strength in the injured area. MAKE SURE YOU:   Understand these instructions.  Will watch your condition.  Will get help right away if you  are not doing well or get worse. Document Released: 08/02/2008 Document Revised: 08/14/2013 Document Reviewed: 05/23/2013 [facility] Patient Information 2015 [place], [place]. This information is not intended to replace advice given to you by your health care provider. Make sure you discuss any questions you have with your health care provider.  Take the prescription medicine as directed. Follow-up with your provider as scheduled.

## 2015-07-01 NOTE — ED Provider Notes (Signed)
[facility] Emergency Department Provider Note ____________________________________________  Time seen: 1735  I have reviewed the triage vital signs and the nursing notes.  HISTORY  Chief Complaint  Leg Pain  HPI Sydney Gilmore is a 49 y.o. female reports to the ED for evaluation and treatment of pain to the lateral right thigh for the last 2-3 months. She denies any recent injury or trauma. She was seen here previously for the same complaint, and notes a normal x-ray. She gives a history of arthritis, but denies any problems with her low back including herniated disc, prior surgeries, or previous back problems. She has not been taking any medication for her pain, claiming that she has no medication at home. She is scheduled to see her new selective primary care provider tomorrow for routine medical evaluation.  Past Medical History  Diagnosis Date  . Hypertension    There are no active problems to display for this patient.  History reviewed. No pertinent past surgical history.  Current Outpatient Rx  Name  Route  Sig  Dispense  Refill  . hydrochlorothiazide (HYDRODIURIL) 12.5 MG tablet   Oral   Take 12.5 mg by mouth daily.         . naproxen (EC NAPROSYN) 500 MG EC tablet   Oral   Take 1 tablet (500 mg total) by mouth 2 (two) times daily with a meal.   30 tablet   0    Allergies Review of patient's allergies indicates no known allergies.  No family history on file.  Social History Social History  Substance Use Topics  . Smoking status: Never Smoker   . Smokeless tobacco: None  . Alcohol Use: No   Review of Systems  Constitutional: Negative for fever. Eyes: Negative for visual changes. ENT: Negative for sore throat. Cardiovascular: Negative for chest pain. Respiratory: Negative for shortness of breath. Gastrointestinal: Negative for abdominal pain, vomiting and diarrhea. Genitourinary: Negative for dysuria. Musculoskeletal: Negative  for back pain. Right thigh pain as above Skin: Negative for rash. Neurological: Negative for headaches, focal weakness or numbness. ____________________________________________  PHYSICAL EXAM:  VITAL SIGNS: ED Triage Vitals  Enc Vitals Group     BP --      Pulse --      Resp --      Temp --      Temp src --      SpO2 --      Weight --      Height --      Head Cir --      Peak Flow --      Pain Score 07/01/15 1710 8     Pain Loc --      Pain Edu? --      Excl. in GC? --    Constitutional: Alert and oriented. Well appearing and in no distress. Eyes: Conjunctivae are normal. PERRL. Normal extraocular movements. ENT   Head: Normocephalic and atraumatic.   Nose: No congestion/rhinnorhea.   Mouth/Throat: Mucous membranes are moist.   Neck: Supple. No thyromegaly. Hematological/Lymphatic/Immunilogical: No cervical lymphadenopathy. Cardiovascular: Normal rate, regular rhythm.  Respiratory: Normal respiratory effort. No wheezes/rales/rhonchi. Gastrointestinal: Soft and nontender. No distention. Musculoskeletal: Nontender with normal range of motion in all extremities. Right hip & knee with normal ROM. Normal toe/heel raise. Spinal exam without deformity, spasm, or step-off. Normal lumbar flexion and extension ROM.  Neurologic: CN II-XII grossly intact. Normal LE DTRs bilaterally. Normal gait without ataxia. Normal speech and language. No gross focal neurologic  deficits are appreciated. Skin:  Skin is warm, dry and intact. No rash noted. Psychiatric: Mood and affect are normal. Patient exhibits appropriate insight and judgment. ____________________________________________  PROCEDURES  Naprosyn 500 mg PO ____________________________________________  INITIAL IMPRESSION / ASSESSMENT AND PLAN / ED COURSE  Right thigh strain. Suggest follow-up with primary provider for further care & management. Prescription for EC Naprosyn provided.   ____________________________________________  FINAL CLINICAL IMPRESSION(S) / ED DIAGNOSES  Final diagnoses:  Muscle strain of thigh, right, subsequent encounter     Lissa Hoard, PA-C 07/01/15 1800  Myrna Blazer, MD 07/01/15 2034

## 2015-07-01 NOTE — ED Notes (Signed)
Has been seen for same in past .conts to have pain and states she has an appt for f/u tomorrow. Pain increased with standing

## 2015-07-02 ENCOUNTER — Encounter: Payer: Self-pay | Admitting: Emergency Medicine

## 2015-08-03 ENCOUNTER — Emergency Department
Admission: EM | Admit: 2015-08-03 | Discharge: 2015-08-03 | Disposition: A | Discharge status: Home / Self Care | Payer: BLUE CROSS/BLUE SHIELD | Attending: Student | Admitting: Student

## 2015-08-03 ENCOUNTER — Encounter: Payer: Self-pay | Admitting: Emergency Medicine

## 2015-08-03 DIAGNOSIS — Y998 Other external cause status: Secondary | ICD-10-CM | POA: Diagnosis not present

## 2015-08-03 DIAGNOSIS — S199XXA Unspecified injury of neck, initial encounter: Secondary | ICD-10-CM | POA: Diagnosis not present

## 2015-08-03 DIAGNOSIS — Z79899 Other long term (current) drug therapy: Secondary | ICD-10-CM | POA: Diagnosis not present

## 2015-08-03 DIAGNOSIS — I10 Essential (primary) hypertension: Secondary | ICD-10-CM | POA: Insufficient documentation

## 2015-08-03 DIAGNOSIS — Y9389 Activity, other specified: Secondary | ICD-10-CM | POA: Insufficient documentation

## 2015-08-03 DIAGNOSIS — Y92410 Unspecified street and highway as the place of occurrence of the external cause: Secondary | ICD-10-CM | POA: Diagnosis not present

## 2015-08-03 DIAGNOSIS — Z791 Long term (current) use of non-steroidal anti-inflammatories (NSAID): Secondary | ICD-10-CM | POA: Insufficient documentation

## 2015-08-03 DIAGNOSIS — S3992XA Unspecified injury of lower back, initial encounter: Secondary | ICD-10-CM | POA: Insufficient documentation

## 2015-08-03 DIAGNOSIS — S4992XA Unspecified injury of left shoulder and upper arm, initial encounter: Secondary | ICD-10-CM | POA: Insufficient documentation

## 2015-08-03 DIAGNOSIS — M7918 Myalgia, other site: Secondary | ICD-10-CM

## 2015-08-03 MED ORDER — CYCLOBENZAPRINE HCL 10 MG PO TABS: 10.0000 mg | ORAL_TABLET | Freq: Three times a day (TID) | ORAL | Status: DC | PRN
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
## 2015-08-11 ENCOUNTER — Emergency Department
Admission: EM | Admit: 2015-08-11 | Discharge: 2015-08-11 | Disposition: A | Discharge status: Home / Self Care | Payer: BLUE CROSS/BLUE SHIELD | Attending: Student | Admitting: Student

## 2015-08-11 ENCOUNTER — Emergency Department: Payer: BLUE CROSS/BLUE SHIELD

## 2015-08-11 ENCOUNTER — Encounter: Payer: Self-pay | Admitting: Intensive Care

## 2015-08-11 DIAGNOSIS — S56912D Strain of unspecified muscles, fascia and tendons at forearm level, left arm, subsequent encounter: Secondary | ICD-10-CM | POA: Insufficient documentation

## 2015-08-11 DIAGNOSIS — I10 Essential (primary) hypertension: Secondary | ICD-10-CM | POA: Insufficient documentation

## 2015-08-11 DIAGNOSIS — S46912D Strain of unspecified muscle, fascia and tendon at shoulder and upper arm level, left arm, subsequent encounter: Secondary | ICD-10-CM | POA: Insufficient documentation

## 2015-08-11 DIAGNOSIS — S59912D Unspecified injury of left forearm, subsequent encounter: Secondary | ICD-10-CM | POA: Diagnosis present

## 2015-08-11 MED ORDER — HYDROCHLOROTHIAZIDE 12.5 MG PO CAPS: 12.5000 mg | ORAL_CAPSULE | Freq: Every day | ORAL | Status: AC

## 2015-08-11 MED ORDER — NAPROXEN 500 MG PO TABS: 500.0000 mg | ORAL_TABLET | Freq: Two times a day (BID) | ORAL | Status: DC

## 2015-08-11 MED ORDER — NAPROXEN 500 MG PO TABS
ORAL_TABLET | ORAL | Status: AC
  Filled 2015-08-11: qty 1

## 2015-08-11 MED ORDER — NAPROXEN 500 MG PO TABS
500.0000 mg | ORAL_TABLET | Freq: Once | ORAL | Status: AC
  Administered 2015-08-11: 500 mg via ORAL
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
## 2015-12-29 ENCOUNTER — Encounter: Payer: Self-pay | Admitting: Emergency Medicine

## 2015-12-29 ENCOUNTER — Emergency Department
Admission: EM | Admit: 2015-12-29 | Discharge: 2015-12-29 | Disposition: A | Discharge status: Home / Self Care | Payer: BLUE CROSS/BLUE SHIELD | Attending: Emergency Medicine | Admitting: Emergency Medicine

## 2015-12-29 DIAGNOSIS — J45909 Unspecified asthma, uncomplicated: Secondary | ICD-10-CM | POA: Insufficient documentation

## 2015-12-29 DIAGNOSIS — J069 Acute upper respiratory infection, unspecified: Secondary | ICD-10-CM | POA: Insufficient documentation

## 2015-12-29 DIAGNOSIS — J029 Acute pharyngitis, unspecified: Secondary | ICD-10-CM

## 2015-12-29 DIAGNOSIS — Z79899 Other long term (current) drug therapy: Secondary | ICD-10-CM | POA: Insufficient documentation

## 2015-12-29 DIAGNOSIS — I10 Essential (primary) hypertension: Secondary | ICD-10-CM | POA: Insufficient documentation

## 2015-12-29 DIAGNOSIS — M940 Chondrocostal junction syndrome [Tietze]: Secondary | ICD-10-CM

## 2015-12-29 HISTORY — DX: Unspecified asthma, uncomplicated: J45.909

## 2015-12-29 LAB — POCT RAPID STREP A: Streptococcus, Group A Screen (Direct): NEGATIVE

## 2015-12-29 MED ORDER — IBUPROFEN 600 MG PO TABS: 600.0000 mg | ORAL_TABLET | Freq: Three times a day (TID) | ORAL | Status: DC | PRN

## 2015-12-29 MED ORDER — LIDOCAINE VISCOUS 2 % MT SOLN: 5.0000 mL | Freq: Four times a day (QID) | OROMUCOSAL | Status: DC | PRN

## 2015-12-29 MED ORDER — HYDROCOD POLST-CPM POLST ER 10-8 MG/5ML PO SUER: 5.0000 mL | Freq: Two times a day (BID) | ORAL | Status: DC

## 2015-12-29 NOTE — Discharge Instructions (Signed)
Costochondritis  Costochondritis is a condition in which the tissue (cartilage) that connects your ribs with your breastbone (sternum) becomes irritated. It causes pain in the chest and rib area. It usually goes away on its own over time.  HOME CARE  · Avoid activities that wear you out.  · Do not strain your ribs. Avoid activities that use your:    Chest.    Belly.    Side muscles.  · Put ice on the area for the first 2 days after the pain starts.    Put ice in a plastic bag.    Place a towel between your skin and the bag.    Leave the ice on for 20 minutes, 2-3 times a day.  · Only take medicine as told by your doctor.  GET HELP IF:  · You have redness or puffiness (swelling) in the rib area.  · Your pain does not go away with rest or medicine.  GET HELP RIGHT AWAY IF:   · Your pain gets worse.  · You are very uncomfortable.  · You have trouble breathing.  · You cough up blood.  · You start sweating or throwing up (vomiting).  · You have a fever or lasting symptoms for more than 2-3 days.  · You have a fever and your symptoms suddenly get worse.  MAKE SURE YOU:   · Understand these instructions.  · Will watch your condition.  · Will get help right away if you are not doing well or get worse.     This information is not intended to replace advice given to you by your health care provider. Make sure you discuss any questions you have with your health care provider.     Document Released: 04/11/2008 Document Revised: 06/26/2013 Document Reviewed: 05/28/2013  Elsevier Interactive Patient Education ©2016 [employer]

## 2015-12-29 NOTE — ED Notes (Signed)
Cough, congestion, sore throat, diarrhea since Friday, c/o of rib pain from coughing.  No acute distress noted.

## 2015-12-29 NOTE — ED Provider Notes (Signed)
[facility] Emergency Department Provider Note  ____________________________________________  Time seen: Approximately 1:36 PM  I have reviewed the triage vital signs and the nursing notes.   HISTORY  Chief Complaint Cough    HPI Sydney Gilmore is a 50 y.o. female patient complaining of cough and sore throat. Days. Patient states she's having rib discomfort secondary to continue coughing. No palliative measures taken for this complaint. Patient is rating her pain discomfort as a 4/10. Patient denies any fever chills or nausea, vomiting,  or vomiting.   Past Medical History  Diagnosis Date  . Hypertension   . Asthma     There are no active problems to display for this patient.   Past Surgical History  Procedure Laterality Date  . Cholecystectomy      Current Outpatient Rx  Name  Route  Sig  Dispense  Refill  . hydrochlorothiazide (MICROZIDE) 12.5 MG capsule   Oral   Take 1 capsule (12.5 mg total) by mouth daily.   14 capsule   0   . naproxen (NAPROSYN) 500 MG tablet   Oral   Take 1 tablet (500 mg total) by mouth 2 (two) times daily with a meal.   30 tablet   0     Allergies Review of patient's allergies indicates no known allergies.  Family History  Problem Relation Age of Onset  . Heart failure Father     Social History Social History  Substance Use Topics  . Smoking status: Never Smoker   . Smokeless tobacco: None  . Alcohol Use: No    Review of Systems Constitutional: No fever/chills Eyes: No visual changes. ENT: Sore throat Cardiovascular: Denies chest pain. Respiratory: Denies shortness of breath. Nonproductive cough Gastrointestinal: No abdominal pain.  No nausea, no vomiting.  No diarrhea.  No constipation. Genitourinary: Negative for dysuria. Musculoskeletal: Negative for back pain. Skin: Negative for rash. Neurological: Negative for headaches, focal weakness or  numbness. Endocrine:Hypertension ___________________________________________   PHYSICAL EXAM:  VITAL SIGNS: ED Triage Vitals  Enc Vitals Group     BP 12/29/15 1324 149/86 mmHg     Pulse Rate 12/29/15 1324 114     Resp 12/29/15 1324 20     Temp 12/29/15 1324 98.8 F (37.1 C)     Temp Source 12/29/15 1324 Oral     SpO2 12/29/15 1324 93 %     Weight 12/29/15 1324 232 lb (105.235 kg)     Height 12/29/15 1324  (1.676 m)     Head Cir --      Peak Flow --      Pain Score 12/29/15 1324 4     Pain Loc --      Pain Edu? --      Excl. in GC? --     Constitutional: Alert and oriented. Well appearing and in no acute distress. Eyes: Conjunctivae are normal. PERRL. EOMI. Head: Atraumatic. Nose: No congestion/rhinnorhea. Mouth/Throat: Mucous membranes are moist.  Oropharynx non-erythematous. Neck: No stridor. No cervical spine tenderness to palpation. Hematological/Lymphatic/Immunilogical: No cervical lymphadenopathy. Cardiovascular: Normal rate, regular rhythm. Grossly normal heart sounds.  Good peripheral circulation. Respiratory: Normal respiratory effort.  No retractions. Lungs CTAB. Nonproductive cough Gastrointestinal: Soft and nontender. No distention. No abdominal bruits. No CVA tenderness. Musculoskeletal: No lower extremity tenderness nor edema.  No joint effusions. Neurologic:  Normal speech and language. No gross focal neurologic deficits are appreciated. No gait instability. Skin:  Skin is warm, dry and intact. No rash noted. Psychiatric: Mood and  affect are normal. Speech and behavior are normal.  ____________________________________________   LABS (all labs ordered are listed, but only abnormal results are displayed)  Labs Reviewed  POCT RAPID STREP A   ____________________________________________  EKG  EKG evaluated by heart station  Dr. ____________________________________________  RADIOLOGY   ____________________________________________   PROCEDURES  Procedure(s) performed: None  Critical Care performed: No  ____________________________________________   INITIAL IMPRESSION / ASSESSMENT AND PLAN / ED COURSE  Pertinent labs & imaging results that were available during my care of the patient were reviewed by me and considered in my medical decision making (see chart for details).  Costochondritis secondary to cough. Viral pharyngitis. Discussed negative rapid strep test with patient advised cultures pending. Patient given a prescription for Tussionex and ibuprofen. Patient given work note for today. Patient advised to follow-up ____________________________________________   FINAL CLINICAL IMPRESSION(S) / ED DIAGNOSES  Final diagnoses:  URI (upper respiratory infection)  Viral pharyngitis  Costochondritis      Joni Reining, PA-C 12/29/15 1350  Joni Reining, PA-C 12/29/15 1351  Richardean Canal, MD 12/29/15 1406

## 2015-12-29 NOTE — ED Notes (Signed)
States she developed a cough and sore throat for several days   Having some discomfort in rib area   No resp distress noted

## 2016-02-25 IMAGING — US US EXTREM LOW VENOUS*R*
1 series · 13 of 24 positions shown · non-contrast
Comparison: None.

CLINICAL DATA: Right lower extremity pain and edema for 1 week.
Evaluate for DVT.



[Series 1: us extrem low venous*right* · 0.08mm/px · 13 of 32 slices shown]
[im 1/32]
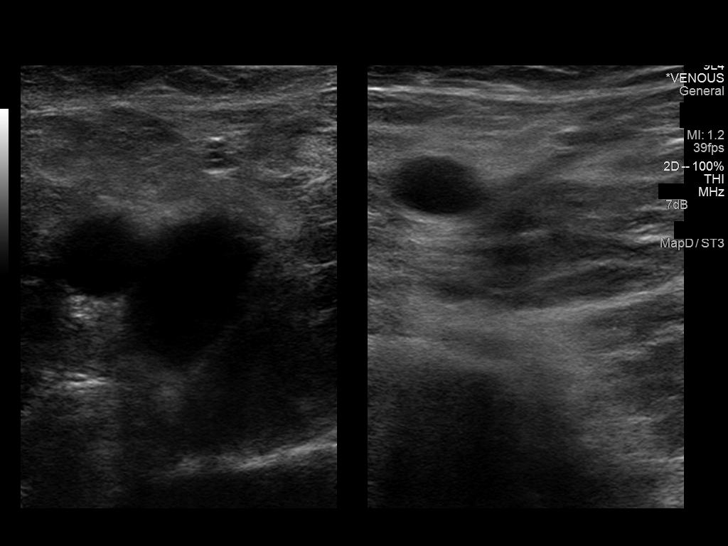
[im 3/32]
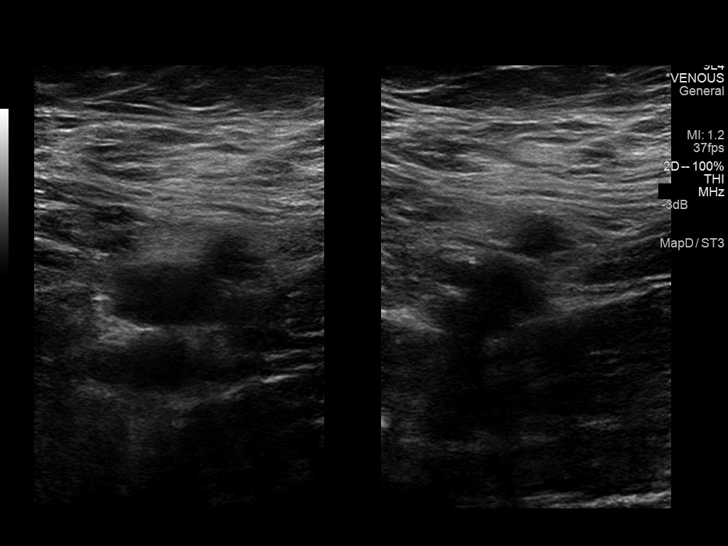
[im 6/32]
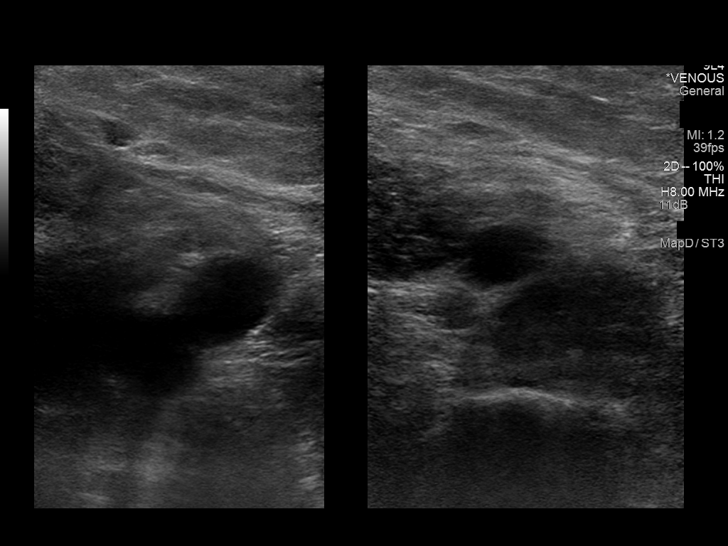
[im 9/32]
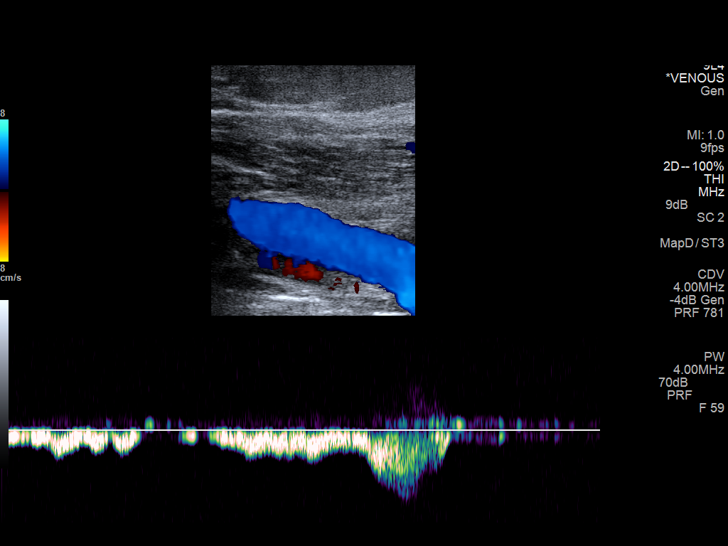
[im 11/32]
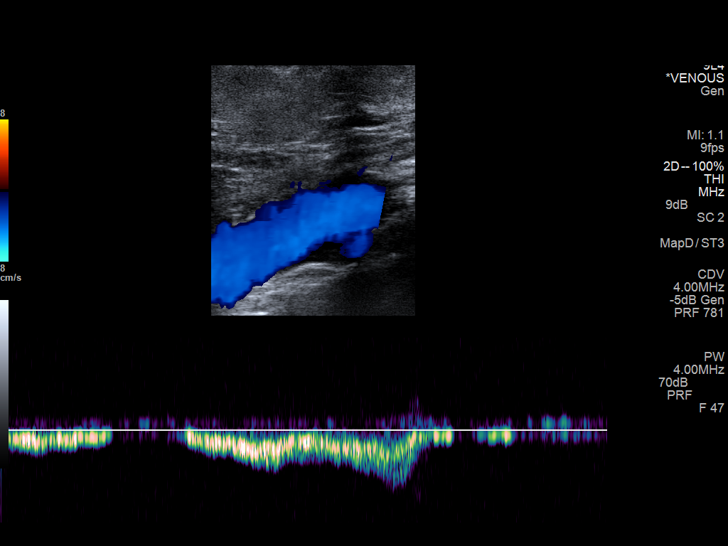
[im 14/32]
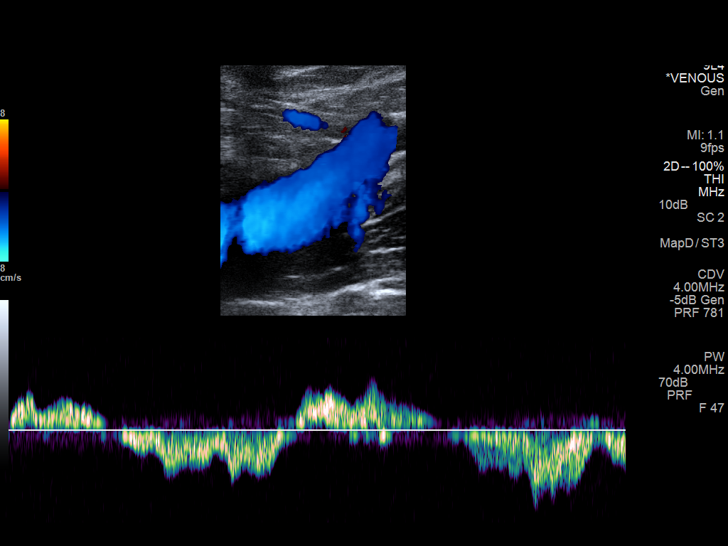
[im 17/32]
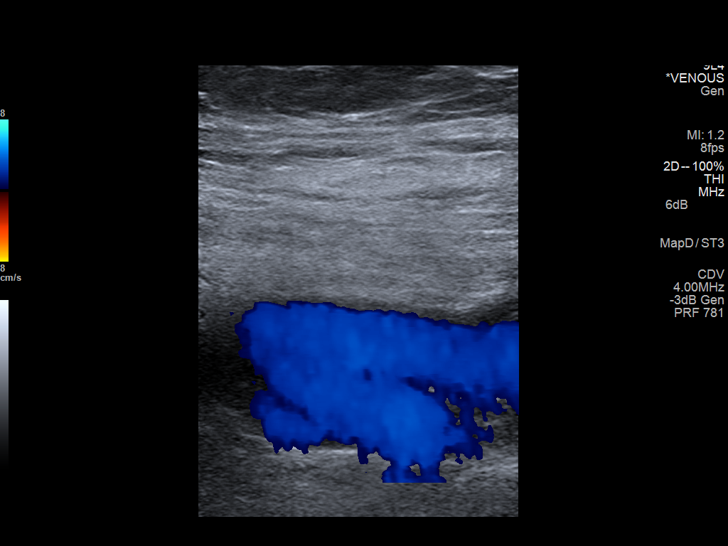
[im 18/32]
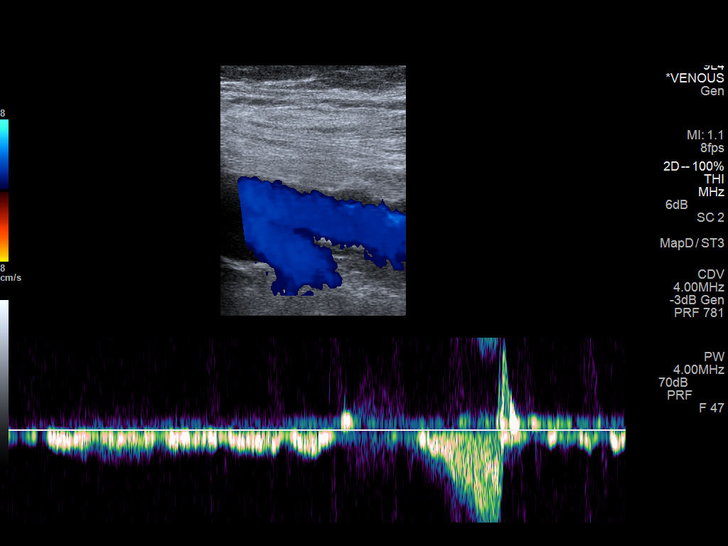
[im 21/32]
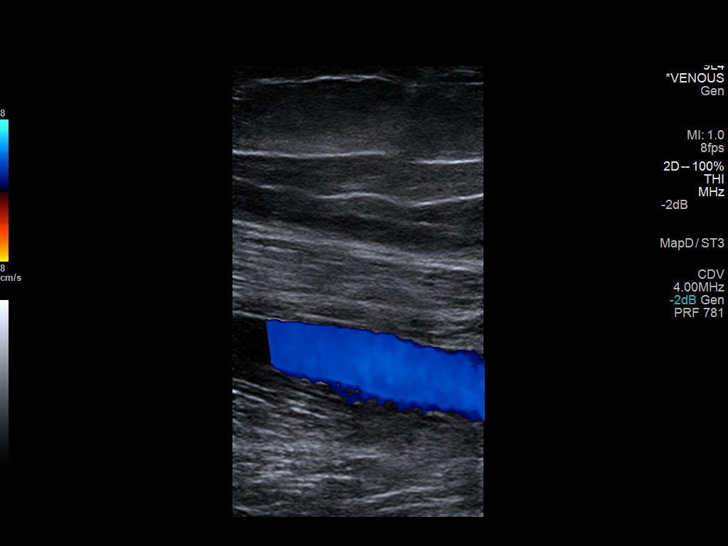
[im 23/32]
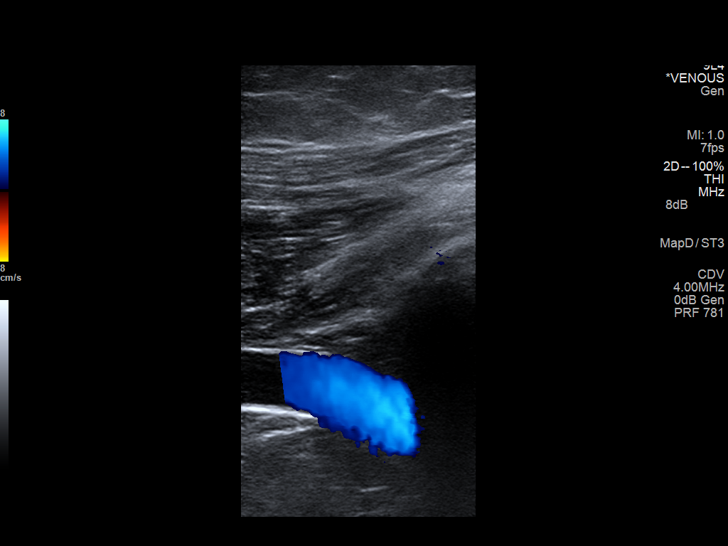
[im 26/32]
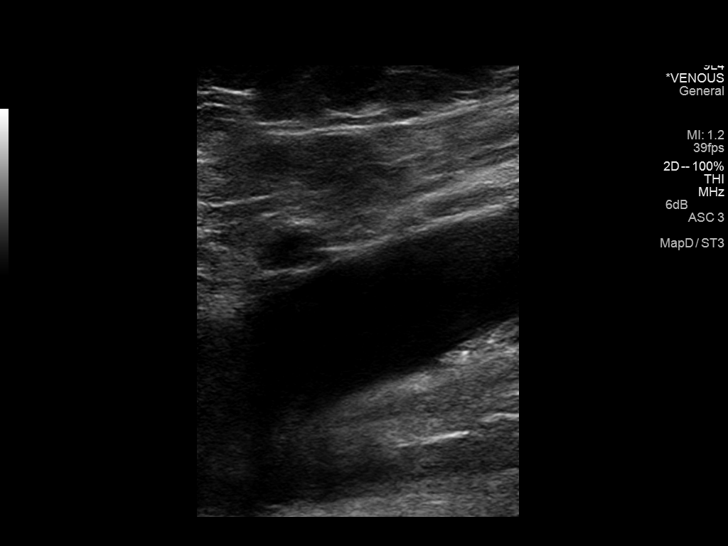
[im 29/32]
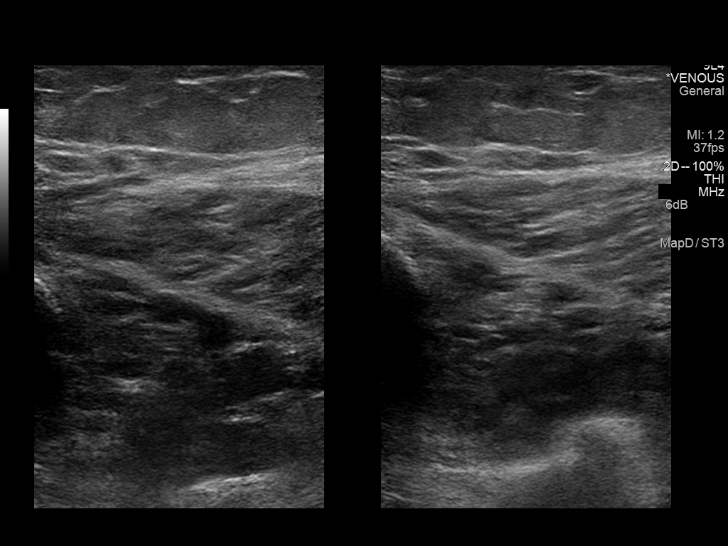
[im 32/32]
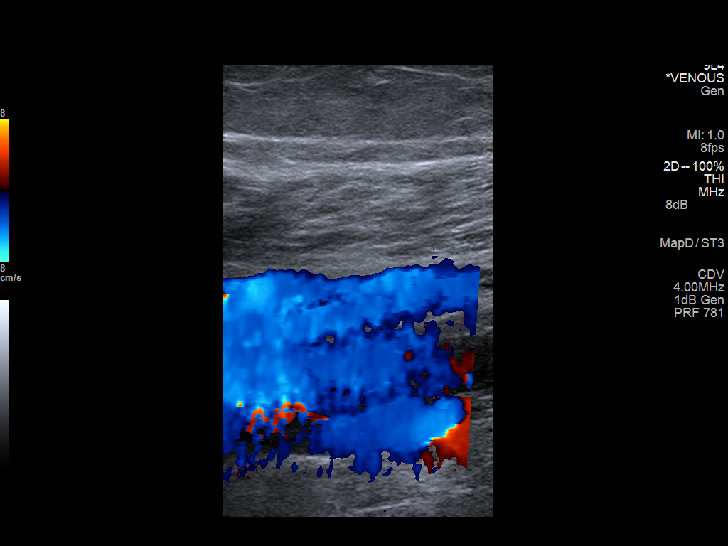

[13 of 24 positions shown; findings below may reference images not displayed]

FINDINGS: Contralateral Common Femoral Vein: Respiratory phasicity is normal
and symmetric with the symptomatic side. No evidence of thrombus.
Normal compressibility.

Common Femoral Vein: No evidence of thrombus. Normal
compressibility, respiratory phasicity and response to augmentation.

Saphenofemoral Junction: No evidence of thrombus. Normal
compressibility and flow on color Doppler imaging.

Profunda Femoral Vein: No evidence of thrombus. Normal
compressibility and flow on color Doppler imaging.

Femoral Vein: No evidence of thrombus. Normal compressibility,
respiratory phasicity and response to augmentation.

Popliteal Vein: No evidence of thrombus. Normal compressibility,
respiratory phasicity and response to augmentation.

Calf Veins: No evidence of thrombus. Normal compressibility and flow
on color Doppler imaging.

Superficial Great Saphenous Vein: No evidence of thrombus. Normal
compressibility and flow on color Doppler imaging.

Venous Reflux:  None.

Other Findings:  None.
IMPRESSION: No evidence of DVT within the right lower extremity.
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
## 2016-11-14 ENCOUNTER — Encounter: Payer: Self-pay | Admitting: Emergency Medicine

## 2016-11-14 ENCOUNTER — Emergency Department
Admission: EM | Admit: 2016-11-14 | Discharge: 2016-11-14 | Disposition: A | Discharge status: Home / Self Care | Payer: BLUE CROSS/BLUE SHIELD | Attending: Emergency Medicine | Admitting: Emergency Medicine

## 2016-11-14 DIAGNOSIS — E860 Dehydration: Secondary | ICD-10-CM | POA: Insufficient documentation

## 2016-11-14 DIAGNOSIS — J45909 Unspecified asthma, uncomplicated: Secondary | ICD-10-CM | POA: Insufficient documentation

## 2016-11-14 DIAGNOSIS — Z791 Long term (current) use of non-steroidal anti-inflammatories (NSAID): Secondary | ICD-10-CM | POA: Insufficient documentation

## 2016-11-14 DIAGNOSIS — I10 Essential (primary) hypertension: Secondary | ICD-10-CM | POA: Insufficient documentation

## 2016-11-14 DIAGNOSIS — K529 Noninfective gastroenteritis and colitis, unspecified: Secondary | ICD-10-CM | POA: Insufficient documentation

## 2016-11-14 LAB — URINALYSIS, COMPLETE (UACMP) WITH MICROSCOPIC
BILIRUBIN URINE: NEGATIVE
Glucose, UA: NEGATIVE mg/dL
KETONES UR: NEGATIVE mg/dL
Nitrite: NEGATIVE
Protein, ur: NEGATIVE mg/dL
Specific Gravity, Urine: 1.025 (ref 1.005–1.030)
pH: 6 (ref 5.0–8.0)

## 2016-11-14 LAB — CBC
HCT: 45.8 % (ref 35.0–47.0)
HEMOGLOBIN: 15.6 g/dL (ref 12.0–16.0)
MCH: 28 pg (ref 26.0–34.0)
MCHC: 34 g/dL (ref 32.0–36.0)
MCV: 82.4 fL (ref 80.0–100.0)
Platelets: 233 10*3/uL (ref 150–440)
RBC: 5.55 MIL/uL — ABNORMAL HIGH (ref 3.80–5.20)
RDW: 13.6 % (ref 11.5–14.5)
WBC: 9.1 10*3/uL (ref 3.6–11.0)

## 2016-11-14 LAB — COMPREHENSIVE METABOLIC PANEL
ALBUMIN: 4.1 g/dL (ref 3.5–5.0)
ALT: 23 U/L (ref 14–54)
AST: 27 U/L (ref 15–41)
Alkaline Phosphatase: 70 U/L (ref 38–126)
Anion gap: 8 (ref 5–15)
BILIRUBIN TOTAL: 0.6 mg/dL (ref 0.3–1.2)
BUN: 18 mg/dL (ref 6–20)
CO2: 26 mmol/L (ref 22–32)
Calcium: 8.9 mg/dL (ref 8.9–10.3)
Chloride: 102 mmol/L (ref 101–111)
Creatinine, Ser: 0.59 mg/dL (ref 0.44–1.00)
GFR calc Af Amer: >60 mL/min (ref >60)
GFR calc non Af Amer: >60 mL/min (ref >60)
GLUCOSE: 130 mg/dL — AB (ref 65–99)
POTASSIUM: 3.9 mmol/L (ref 3.5–5.1)
SODIUM: 136 mmol/L (ref 135–145)
Total Protein: 7.5 g/dL (ref 6.5–8.1)

## 2016-11-14 LAB — LIPASE, BLOOD: Lipase: 18 U/L (ref 11–51)

## 2016-11-14 MED ORDER — SODIUM CHLORIDE 0.9 % IV BOLUS (SEPSIS)
1000.0000 mL | Freq: Once | INTRAVENOUS | Status: AC
  Administered 2016-11-14: 1000 mL via INTRAVENOUS

## 2016-11-14 MED ORDER — METHYLPREDNISOLONE SODIUM SUCC 125 MG IJ SOLR: 125.0000 mg | Freq: Once | INTRAMUSCULAR | Status: DC

## 2016-11-14 MED ORDER — ONDANSETRON HCL 4 MG PO TABS: 4.0000 mg | ORAL_TABLET | Freq: Three times a day (TID) | ORAL | 0 refills | Status: DC | PRN

## 2016-11-14 MED ORDER — ONDANSETRON HCL 4 MG/2ML IJ SOLN
4.0000 mg | Freq: Once | INTRAMUSCULAR | Status: AC
  Administered 2016-11-14: 4 mg via INTRAVENOUS
  Filled 2016-11-14: qty 2

## 2016-11-14 NOTE — ED Provider Notes (Signed)
[facility] Emergency Department Provider Note  ____________________________________________   I have reviewed the triage vital signs and the nursing notes.   HISTORY  Chief Complaint Emesis and Diarrhea    HPI Sydney Gilmore is a 51 y.o. female presents to the complaining of nausea vomiting diarrhea. Patient has had symptoms since this morning. No fever. No focal abdominal pain. No melena bright red blood per rectum. Large been a burden of the same. Positive sick contacts.   Denies any abdominal pain at this time did have cramping with diarrhea earlier.   Past Medical History:  Diagnosis Date  . Asthma   . Hypertension     There are no active problems to display for this patient.   Past Surgical History:  Procedure Laterality Date  . CHOLECYSTECTOMY      Prior to Admission medications   Medication Sig Start Date End Date Taking? Authorizing Provider  chlorpheniramine-HYDROcodone (TUSSIONEX PENNKINETIC ER) 10-8 MG/5ML SUER Take 5 mLs by mouth 2 (two) times daily. 12/29/15   Joni Reiningonald K Smith, PA-C  ibuprofen (ADVIL,MOTRIN) 600 MG tablet Take 1 tablet (600 mg total) by mouth every 8 (eight) hours as needed for fever, headache or mild pain. 12/29/15   Joni Reiningonald K Smith, PA-C  lidocaine (XYLOCAINE) 2 % solution Use as directed 5 mLs in the mouth or throat every 6 (six) hours as needed for mouth pain. 12/29/15   Joni Reiningonald K Smith, PA-C  naproxen (NAPROSYN) 500 MG tablet Take 1 tablet (500 mg total) by mouth 2 (two) times daily with a meal. 08/11/15   Christella ScheuermannEmma V Lawrence, PA-C    Allergies Patient has no known allergies.  Family History  Problem Relation Age of Onset  . Heart failure Father     Social History Social History  Substance Use Topics  . Smoking status: Never Smoker  . Smokeless tobacco: Not on file  . Alcohol use No    Review of Systems Constitutional: No fever/chills Eyes: No visual changes. ENT: No sore throat. No stiff neck no neck  pain Cardiovascular: Denies chest pain. Respiratory: Denies shortness of breath. Gastrointestinal:   nThe history of present illness Genitourinary: Negative for dysuria. Musculoskeletal: Negative lower extremity swelling Skin: Negative for rash. Neurological: Negative for severe headaches, focal weakness or numbness. 10-point ROS otherwise negative.  ____________________________________________   PHYSICAL EXAM:  VITAL SIGNS: ED Triage Vitals  Enc Vitals Group     BP 11/14/16 0839 (!) 137/96     Pulse Rate 11/14/16 0839 (!) 108     Resp 11/14/16 0839 18     Temp 11/14/16 0839 97.6 F (36.4 C)     Temp Source 11/14/16 0839 Oral     SpO2 11/14/16 0839 96 %     Weight 11/14/16 0840 234 lb (106.1 kg)     Height 11/14/16 0840 5\' 6"  (1.676 m)     Head Circumference --      Peak Flow --      Pain Score 11/14/16 0840 6     Pain Loc --      Pain Edu? --      Excl. in GC? --     Constitutional: Alert and oriented. Well appearing and in no acute distress. Eyes: Conjunctivae are normal. PERRL. EOMI. Head: Atraumatic. Nose: No congestion/rhinnorhea. Mouth/Throat: Mucous membranes are moist.  Oropharynx non-erythematous. Neck: No stridor.   Nontender with no meningismus Cardiovascular: Normal rate, regular rhythm. Grossly normal heart sounds.  Good peripheral circulation. Respiratory: Normal respiratory effort.  No  retractions. Lungs CTAB. Abdominal: Soft and nontender. No distention. No guarding no rebound Back:  There is no focal tenderness or step off.  there is no midline tenderness there are no lesions noted. there is no CVA tenderness Musculoskeletal: No lower extremity tenderness, no upper extremity tenderness. No joint effusions, no DVT signs strong distal pulses no edema Neurologic:  Normal speech and language. No gross focal neurologic deficits are appreciated.  Skin:  Skin is warm, dry and intact. No rash noted. Psychiatric: Mood and affect are normal. Speech and behavior  are normal.  ____________________________________________   LABS (all labs ordered are listed, but only abnormal results are displayed)  Labs Reviewed  COMPREHENSIVE METABOLIC PANEL - Abnormal; Notable for the following:       Result Value   Glucose, Bld 130 (*)    All other components within normal limits  CBC - Abnormal; Notable for the following:    RBC 5.55 (*)    All other components within normal limits  URINALYSIS, COMPLETE (UACMP) WITH MICROSCOPIC - Abnormal; Notable for the following:    Color, Urine YELLOW (*)    APPearance CLEAR (*)    Hgb urine dipstick MODERATE (*)    Leukocytes, UA SMALL (*)    Bacteria, UA RARE (*)    Squamous Epithelial / LPF 6-30 (*)    All other components within normal limits  LIPASE, BLOOD   ____________________________________________  EKG  I personally interpreted any EKGs ordered by me or triage  ____________________________________________  RADIOLOGY  I reviewed any imaging ordered by me or triage that were performed during my shift and, if possible, patient and/or family made aware of any abnormal findings. ____________________________________________   PROCEDURES  Procedure(s) performed: None  Procedures  Critical Care performed: None  ____________________________________________   INITIAL IMPRESSION / ASSESSMENT AND PLAN / ED COURSE  Pertinent labs & imaging results that were available during my care of the patient were reviewed by me and considered in my medical decision making (see chart for details).  H with nausea vomiting diarrhea, very large community burden of same. Giving IV fluids, nausea medication and she'll be safe for discharge after that. Nothing to suggest acute intra-abdominal pathology or referred cardiac pathology at this time.  Clinical Course    ____________________________________________   FINAL CLINICAL IMPRESSION(S) / ED DIAGNOSES  Final diagnoses:  None      This chart was  dictated using voice recognition software.  Despite best efforts to proofread,  errors can occur which can change meaning.      Jeanmarie Plant, MD 11/14/16 [phone]

## 2016-11-14 NOTE — ED Triage Notes (Signed)
Pt reports nausea, vomiting and diarrhea that began yesterday. Pt denies pain just states her belly feels funny. Pt in no apparent distress in triage.

## 2016-11-14 NOTE — ED Notes (Signed)
Line started with NS; 1 Liter bag hung; scanner available but would not respond. No time to call IT again.

## 2016-11-14 NOTE — ED Notes (Addendum)
Pt presents with vomiting and diarrhea since midnight. Pt thinks it may be related to some bad food. She reports emesis x 4, diarrhea x 5, but not sure when she ate the food. Pt states that another family member had nausea, but did not have vomiting.    Last emesis at 0600; last diarrhea at 0700

## 2016-12-13 ENCOUNTER — Emergency Department: Payer: BLUE CROSS/BLUE SHIELD

## 2016-12-13 ENCOUNTER — Encounter: Payer: Self-pay | Admitting: Emergency Medicine

## 2016-12-13 DIAGNOSIS — Z5321 Procedure and treatment not carried out due to patient leaving prior to being seen by health care provider: Secondary | ICD-10-CM | POA: Insufficient documentation

## 2016-12-13 DIAGNOSIS — Z7722 Contact with and (suspected) exposure to environmental tobacco smoke (acute) (chronic): Secondary | ICD-10-CM | POA: Insufficient documentation

## 2016-12-13 DIAGNOSIS — J45909 Unspecified asthma, uncomplicated: Secondary | ICD-10-CM | POA: Insufficient documentation

## 2016-12-13 DIAGNOSIS — Z791 Long term (current) use of non-steroidal anti-inflammatories (NSAID): Secondary | ICD-10-CM | POA: Insufficient documentation

## 2016-12-13 DIAGNOSIS — R0602 Shortness of breath: Secondary | ICD-10-CM | POA: Insufficient documentation

## 2016-12-13 DIAGNOSIS — I10 Essential (primary) hypertension: Secondary | ICD-10-CM | POA: Insufficient documentation

## 2016-12-13 MED ORDER — ALBUTEROL SULFATE (2.5 MG/3ML) 0.083% IN NEBU
5.0000 mg | INHALATION_SOLUTION | Freq: Once | RESPIRATORY_TRACT | Status: AC
  Administered 2016-12-14: 5 mg via RESPIRATORY_TRACT
  Filled 2016-12-13: qty 6

## 2016-12-13 NOTE — ED Triage Notes (Signed)
Pt presents to ED via wheelchair with c/o shortness of breath tonight accompanied by cough and chest feeling "funny", pt reports hx of asthma.

## 2016-12-14 ENCOUNTER — Emergency Department
Admission: EM | Admit: 2016-12-14 | Discharge: 2016-12-14 | Disposition: A | Discharge status: Home / Self Care | Payer: BLUE CROSS/BLUE SHIELD | Attending: Emergency Medicine | Admitting: Emergency Medicine

## 2016-12-14 HISTORY — DX: Unspecified osteoarthritis, unspecified site: M19.90

## 2016-12-14 LAB — CBC
HCT: 43.1 % (ref 35.0–47.0)
HEMOGLOBIN: 14.3 g/dL (ref 12.0–16.0)
MCH: 27.7 pg (ref 26.0–34.0)
MCHC: 33.1 g/dL (ref 32.0–36.0)
MCV: 83.6 fL (ref 80.0–100.0)
PLATELETS: 262 10*3/uL (ref 150–440)
RBC: 5.15 MIL/uL (ref 3.80–5.20)
RDW: 13.7 % (ref 11.5–14.5)
WBC: 7.9 10*3/uL (ref 3.6–11.0)

## 2016-12-14 LAB — BASIC METABOLIC PANEL
Anion gap: 5 (ref 5–15)
BUN: 17 mg/dL (ref 6–20)
CALCIUM: 9 mg/dL (ref 8.9–10.3)
CO2: 31 mmol/L (ref 22–32)
Chloride: 103 mmol/L (ref 101–111)
Creatinine, Ser: 0.98 mg/dL (ref 0.44–1.00)
GFR calc non Af Amer: >60 mL/min (ref >60)
Glucose, Bld: 123 mg/dL — ABNORMAL HIGH (ref 65–99)
Potassium: 4.1 mmol/L (ref 3.5–5.1)
SODIUM: 139 mmol/L (ref 135–145)

## 2016-12-14 LAB — TROPONIN I

## 2016-12-14 NOTE — ED Notes (Signed)
Pt approached the triage desk stating she feels a lot better after her breathing treatment, and she needs to pick up her husband.  Pt was told to come back if she feels worse.  Pt walked out of the ED lobby

## 2017-01-30 ENCOUNTER — Encounter: Payer: Self-pay | Admitting: *Deleted

## 2017-01-30 ENCOUNTER — Emergency Department
Admission: EM | Admit: 2017-01-30 | Discharge: 2017-01-30 | Disposition: A | Discharge status: Home / Self Care | Payer: BLUE CROSS/BLUE SHIELD | Attending: Emergency Medicine | Admitting: Emergency Medicine

## 2017-01-30 DIAGNOSIS — Z7722 Contact with and (suspected) exposure to environmental tobacco smoke (acute) (chronic): Secondary | ICD-10-CM | POA: Insufficient documentation

## 2017-01-30 DIAGNOSIS — I10 Essential (primary) hypertension: Secondary | ICD-10-CM | POA: Insufficient documentation

## 2017-01-30 DIAGNOSIS — K029 Dental caries, unspecified: Secondary | ICD-10-CM | POA: Insufficient documentation

## 2017-01-30 DIAGNOSIS — R030 Elevated blood-pressure reading, without diagnosis of hypertension: Secondary | ICD-10-CM

## 2017-01-30 DIAGNOSIS — J45909 Unspecified asthma, uncomplicated: Secondary | ICD-10-CM | POA: Insufficient documentation

## 2017-01-30 MED ORDER — PENICILLIN V POTASSIUM 500 MG PO TABS: 500.0000 mg | ORAL_TABLET | Freq: Four times a day (QID) | ORAL | 0 refills | Status: DC

## 2017-01-30 NOTE — Discharge Instructions (Signed)
Begin taking penicillin tablets 4 times a day for the next 7 days. Tylenol or ibuprofen as needed for pain. Make an appointment for recheck of your  blood pressure or [facility]. Also call and make an appointment for dental care. A list of dental clinics is given to you today. You may also try calling for a primary care doctor. Possibilities include [facility], [facility], [facility], [facility], and [facility] acute[facility]care.

## 2017-01-30 NOTE — ED Notes (Signed)
See triage note  States she developed tooth pain couple of weeks ago  Pain became worse last pm

## 2017-01-30 NOTE — ED Triage Notes (Addendum)
Pt states toothache front tooth  And back left tooth for several weeks, awake and alert in no acute distress

## 2017-01-30 NOTE — ED Provider Notes (Signed)
[facility] Emergency Department Provider Note   ____________________________________________   First MD Initiated Contact with Patient 01/30/17 1005     (approximate)  I have reviewed the triage vital signs and the nursing notes.   HISTORY  Chief Complaint Dental Pain    HPI Sydney Gilmore is a 51 y.o. female is here with complaint of dental pain. Patient states that she has had tooth pain for several weeks and has not followed up with a dentist for this. Patient states that she intends to get a dental appointment after she has taken the antibiotics. She is unaware of any fever or chills. Patient also has an elevated blood pressure and states that she is not aware of hypertension. She states that her mother does have hypertension. Patient does not have a primary care doctor and "I always come to the emergency room for all my medical problems". She states she has been taking over-the-counter medication for her dental pain and only wants an antibiotic. Rates her pain as a 6/10.   Past Medical History:  Diagnosis Date  . Arthritis   . Asthma   . Hypertension     There are no active problems to display for this patient.   Past Surgical History:  Procedure Laterality Date  . CHOLECYSTECTOMY      Prior to Admission medications   Medication Sig Start Date End Date Taking? Authorizing Provider  penicillin v potassium (VEETID) 500 MG tablet Take 1 tablet (500 mg total) by mouth 4 (four) times daily. 01/30/17   Tommi Rumps, PA-C    Allergies Patient has no known allergies.  Family History  Problem Relation Age of Onset  . Heart failure Father     Social History Social History  Substance Use Topics  . Smoking status: Passive Smoke Exposure - Never Smoker  . Smokeless tobacco: Never Used  . Alcohol use No    Review of Systems Constitutional: No fever/chills Eyes: No visual changes. ENT: No sore throat.Positive dental  pain. Cardiovascular: Denies chest pain. Negative for hypertension. Respiratory: Denies shortness of breath. Gastrointestinal:   No nausea, no vomiting.   Musculoskeletal: Negative for back pain. Skin: Negative for rash. Neurological: Negative for headaches  10-point ROS otherwise negative.  ____________________________________________   PHYSICAL EXAM:  VITAL SIGNS: ED Triage Vitals  Enc Vitals Group     BP 01/30/17 0945 (!) 157/96     Pulse Rate 01/30/17 0945 84     Resp 01/30/17 0945 18     Temp 01/30/17 0945 98.5 F (36.9 C)     Temp Source 01/30/17 0945 Oral     SpO2 01/30/17 0945 100 %     Weight 01/30/17 0944 234 lb (106.1 kg)     Height 01/30/17 0944 5\' 6"  (1.676 m)     Head Circumference --      Peak Flow --      Pain Score 01/30/17 0944 6     Pain Loc --      Pain Edu? --      Excl. in [place]? --     Constitutional: Alert and oriented. Well appearing and in no acute distress. Eyes: Conjunctivae are normal. PERRL. EOMI. Head: Atraumatic. Nose: No congestion/rhinnorhea. Mouth/Throat: Nonerythematous. Membranes are moist. Patient has numerous dental caries and extremely poor dental hygiene and repair. Patient has multiple teeth that are broken off at the gum. Her left upper central incisor is easily movable. No obvious dental abscess was seen. Neck: No stridor.  Hematological/Lymphatic/Immunilogical: No cervical lymphadenopathy. Cardiovascular: Normal rate, regular rhythm. Grossly normal heart sounds.  Good peripheral circulation. Respiratory: Normal respiratory effort.  No retractions. Lungs CTAB. Musculoskeletal: No lower extremity tenderness nor edema.  No joint effusions. Neurologic:  Normal speech and language. No gross focal neurologic deficits are appreciated. No gait instability. Skin:  Skin is warm, dry and intact. No rash noted. Psychiatric: Mood and affect are normal. Speech and behavior are normal.  ____________________________________________    LABS (all labs ordered are listed, but only abnormal results are displayed)  Labs Reviewed - No data to display   PROCEDURES  Procedure(s) performed: None  Procedures  Critical Care performed: No  ____________________________________________   INITIAL IMPRESSION / ASSESSMENT AND PLAN / ED COURSE  Pertinent labs & imaging results that were available during my care of the patient were reviewed by me and considered in my medical decision making (see chart for details).  Patient was given a prescription for Pen-Vee K 500 mg 4 times a day for 7 days. Second blood pressure was decreased from her initial reading which was elevated. Patient was given a list of dental clinics to follow-up with. She was also given numerous medical clinics to follow-up and become outpatient somewhere. Patient was given information about [facility], [facility], [facility], and [facility].      ____________________________________________   FINAL CLINICAL IMPRESSION(S) / ED DIAGNOSES  Final diagnoses:  Dental caries  Pain due to dental caries  Elevated blood pressure reading      NEW MEDICATIONS STARTED DURING THIS VISIT:  Discharge Medication List as of 01/30/2017 10:19 AM    START taking these medications   Details  penicillin v potassium (VEETID) 500 MG tablet Take 1 tablet (500 mg total) by mouth 4 (four) times daily., Starting Mon 01/30/2017, Print         Note:  This document was prepared using Dragon voice recognition software and may include unintentional dictation errors.    Tommi RumpsRhonda L Ebonie Westerlund, PA-C 01/30/17 1128    Emily FilbertJonathan E Williams, MD 01/30/17 [phone]

## 2018-02-12 IMAGING — CR DG CHEST 2V
2 series · 2 of 2 positions shown · non-contrast
Comparison: 12/14/2014

CLINICAL DATA: Cough and shortness of breath.

EXAM:
CHEST  2 VIEW

[chest pa]
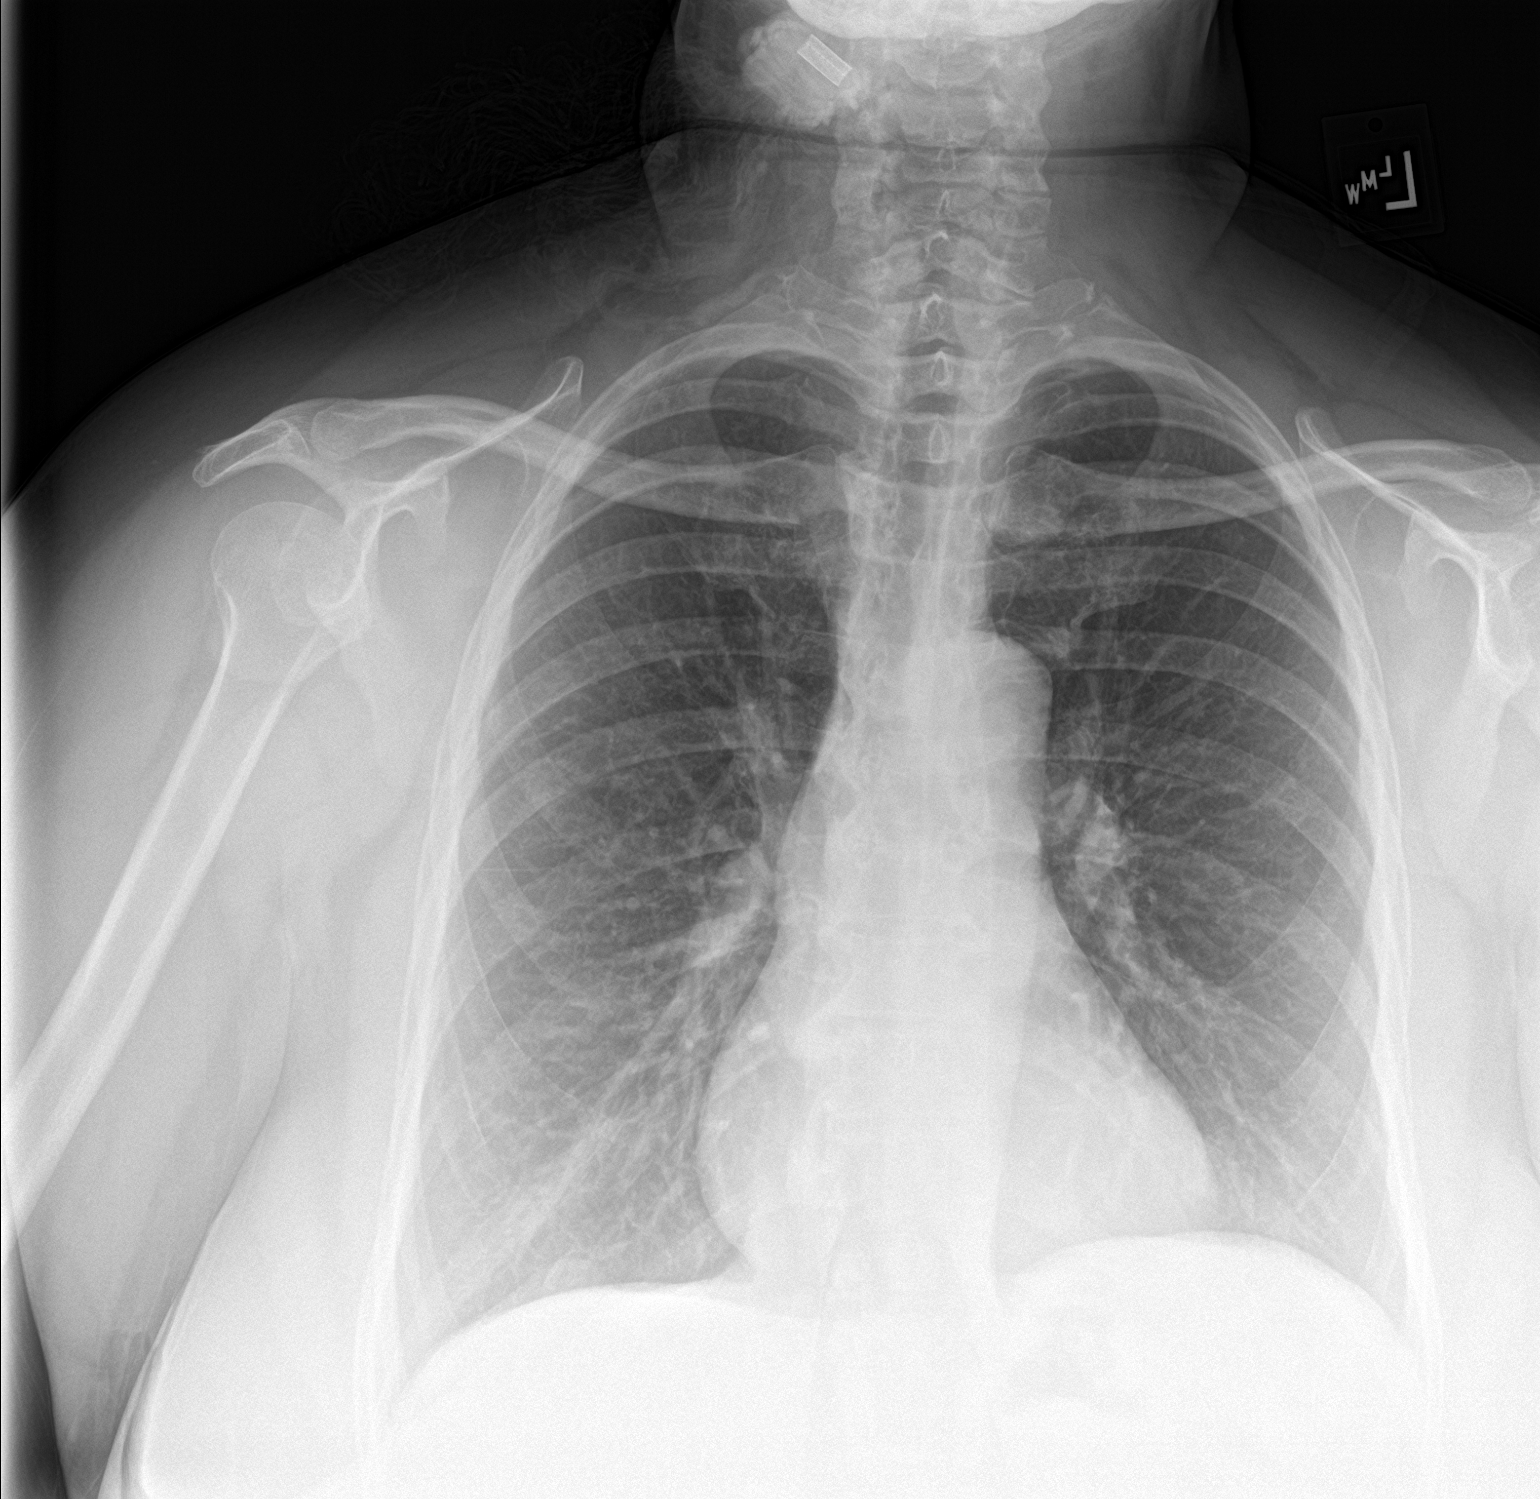

[chest lat]
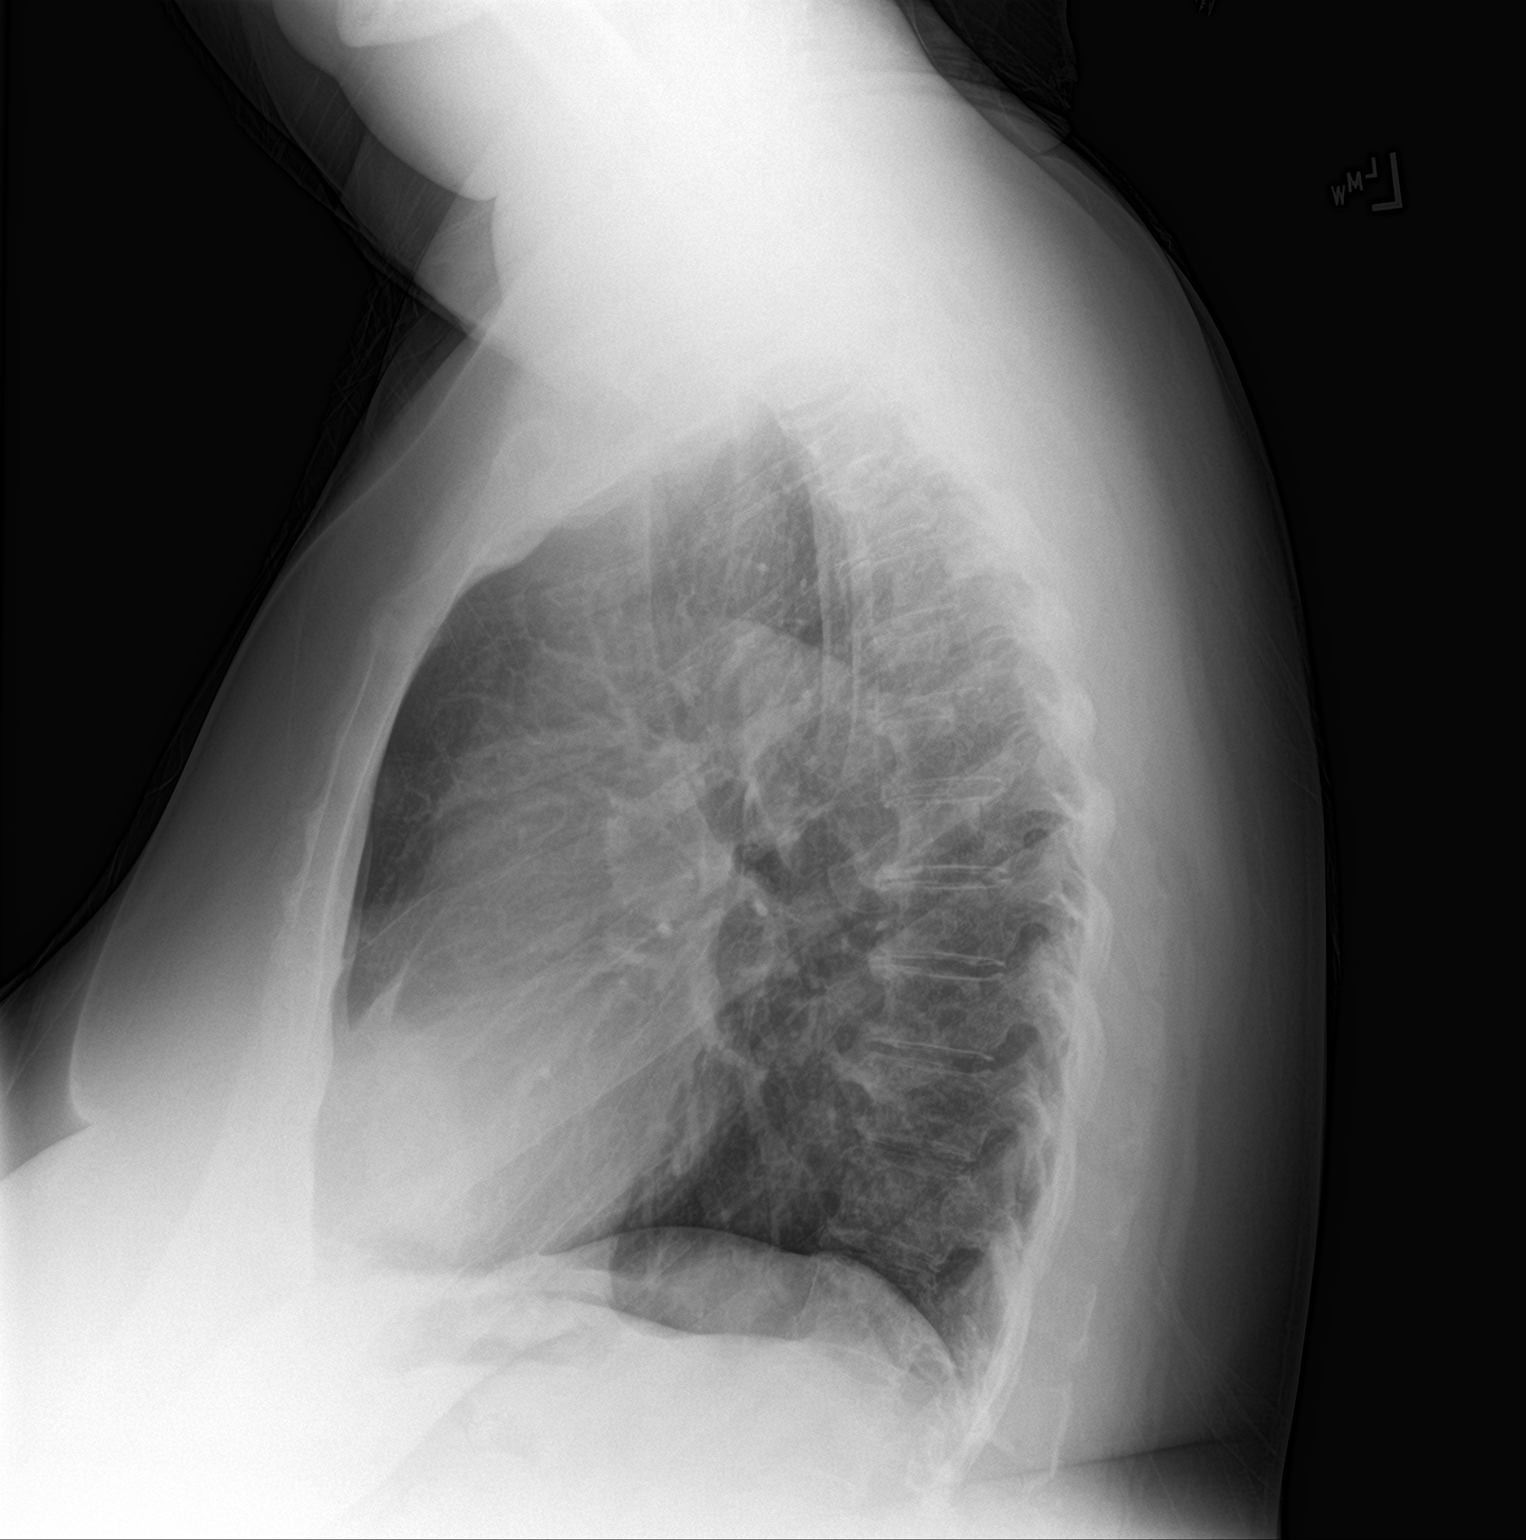

[2 of 2 positions shown; findings below may reference images not displayed]

FINDINGS: Hyperinflation may indicate asthma or emphysema. Normal heart size
and pulmonary vascularity. No focal airspace disease or
consolidation in the lungs. No blunting of costophrenic angles. No
pneumothorax. Mediastinal contours appear intact. Degenerative
changes in the spine.
IMPRESSION: No active cardiopulmonary disease.

## 2020-02-10 ENCOUNTER — Emergency Department
Admission: EM | Admit: 2020-02-10 | Discharge: 2020-02-10 | Disposition: A | Discharge status: Home / Self Care | Payer: Self-pay | Attending: Emergency Medicine | Admitting: Emergency Medicine

## 2020-02-10 ENCOUNTER — Other Ambulatory Visit: Payer: Self-pay

## 2020-02-10 ENCOUNTER — Emergency Department: Payer: Self-pay

## 2020-02-10 DIAGNOSIS — Z7722 Contact with and (suspected) exposure to environmental tobacco smoke (acute) (chronic): Secondary | ICD-10-CM | POA: Insufficient documentation

## 2020-02-10 DIAGNOSIS — M5431 Sciatica, right side: Secondary | ICD-10-CM | POA: Insufficient documentation

## 2020-02-10 DIAGNOSIS — R52 Pain, unspecified: Secondary | ICD-10-CM

## 2020-02-10 DIAGNOSIS — I10 Essential (primary) hypertension: Secondary | ICD-10-CM | POA: Insufficient documentation

## 2020-02-10 MED ORDER — MELOXICAM 15 MG PO TABS: 15.0000 mg | ORAL_TABLET | Freq: Every day | ORAL | 0 refills | Status: DC

## 2020-02-10 MED ORDER — KETOROLAC TROMETHAMINE 30 MG/ML IJ SOLN
30.0000 mg | Freq: Once | INTRAMUSCULAR | Status: AC
  Administered 2020-02-10: 30 mg via INTRAMUSCULAR
  Filled 2020-02-10: qty 1

## 2020-02-10 NOTE — ED Provider Notes (Signed)
[facility] Emergency Department Provider Note  ____________________________________________   First MD Initiated Contact with Patient 02/10/20 1202     (approximate)  I have reviewed the triage vital signs and the nursing notes.   HISTORY  Chief Complaint Hip Pain   HPI Sydney Gilmore is a 54 y.o. female presents to the ED with complaint of right hip pain.  Patient states that the pain moves down her leg to mid thigh area.  Patient states it started 3 months ago altogether.  Patient states she knows she has arthritis in her knees bilaterally.  She denies any recent injury to her right hip.  Patient continues to ambulate but states that pain has gradually increased.  Patient has taken over-the-counter medication without any relief.  She rates her pain as 4 out of 10.       Past Medical History:  Diagnosis Date  . Arthritis   . Asthma   . Hypertension     There are no problems to display for this patient.   Past Surgical History:  Procedure Laterality Date  . CHOLECYSTECTOMY      Prior to Admission medications   Medication Sig Start Date End Date Taking? Authorizing Provider  meloxicam (MOBIC) 15 MG tablet Take 1 tablet (15 mg total) by mouth daily. 02/10/20 02/09/21  Johnn Hai, PA-C    Allergies Patient has no known allergies.  Family History  Problem Relation Age of Onset  . Heart failure Father     Social History Social History   Tobacco Use  . Smoking status: Passive Smoke Exposure - Never Smoker  . Smokeless tobacco: Never Used  Substance Use Topics  . Alcohol use: No  . Drug use: No    Review of Systems Constitutional: No fever/chills Cardiovascular: Denies chest pain. Respiratory: Denies shortness of breath. Gastrointestinal: No abdominal pain.  No nausea, no vomiting.  No diarrhea. Genitourinary: Negative for dysuria. Musculoskeletal: Negative for low back pain, positive for right hip pain. Skin: Negative for  rash. Neurological: Negative for headaches, focal weakness or numbness. ____________________________________________   PHYSICAL EXAM:  VITAL SIGNS: ED Triage Vitals  Enc Vitals Group     BP 02/10/20 1137 (!) 159/93     Pulse Rate 02/10/20 1137 95     Resp 02/10/20 1137 18     Temp 02/10/20 1140 98.2 F (36.8 C)     Temp Source 02/10/20 1137 Oral     SpO2 02/10/20 1137 96 %     Weight 02/10/20 1138 215 lb (97.5 kg)     Height 02/10/20 1138 5\' 6"  (1.676 m)     Head Circumference --      Peak Flow --      Pain Score 02/10/20 1138 4     Pain Loc --      Pain Edu? --      Excl. in [place]? --     Constitutional: Alert and oriented. Well appearing and in no acute distress. Eyes: Conjunctivae are normal.  Head: Atraumatic. Neck: No stridor.   Cardiovascular: Normal rate, regular rhythm. Grossly normal heart sounds.  Good peripheral circulation. Respiratory: Normal respiratory effort.  No retractions. Lungs CTAB. Gastrointestinal: Soft and nontender. No distention.  Musculoskeletal: Nontender on palpation of the thoracic or lumbar spine until you get to the lower lumbar sacral area and patient has some mild tenderness.  There is also some right SI joint area tenderness and surrounding soft tissue.  Range of motion is not restricted and  patient has negative straight leg raises.  Good muscle strength bilaterally. Neurologic:  Normal speech and language. No gross focal neurologic deficits are appreciated.  Reflexes are 2+ bilaterally.  No gait instability. Skin:  Skin is warm, dry and intact. No rash noted.  No discoloration noted. Psychiatric: Mood and affect are normal. Speech and behavior are normal.  ____________________________________________   LABS (all labs ordered are listed, but only abnormal results are displayed)  Labs Reviewed - No data to display  RADIOLOGY   Official radiology report(s): DG Lumbar Spine 2-3 Views  Result Date: 02/10/2020 CLINICAL DATA:  Right hip  pain radiating down legs for 3 months EXAM: LUMBAR SPINE - 2-3 VIEW COMPARISON:  None. FINDINGS: There is no evidence of lumbar spine fracture. Alignment is normal. Intervertebral disc spaces are maintained. Probable calcified fibroids in the left hemipelvis. IMPRESSION: No fracture or dislocation of the lumbar spine. Disc spaces and vertebral body heights are preserved. Lumbar disc and neural foraminal pathology may be further evaluated by MRI if indicated by neurologically localizing signs and symptoms. Electronically Signed   By: Lauralyn Primes M.D.   On: 02/10/2020 13:14   DG HIP UNILAT WITH PELVIS 2-3 VIEWS RIGHT  Result Date: 02/10/2020 CLINICAL DATA:  Right thigh pain radiating from back EXAM: DG HIP (WITH OR WITHOUT PELVIS) 2-3V RIGHT COMPARISON:  None. FINDINGS: There is no evidence of hip fracture or dislocation. There is no evidence of arthropathy or other focal bone abnormality. IMPRESSION: No fracture or dislocation of the right hip. No significant arthrosis. Electronically Signed   By: Lauralyn Primes M.D.   On: 02/10/2020 13:15    ____________________________________________   PROCEDURES  Procedure(s) performed (including Critical Care):  Procedures   ____________________________________________   INITIAL IMPRESSION / ASSESSMENT AND PLAN / ED COURSE  As part of my medical decision making, I reviewed the following data within the electronic MEDICAL RECORD NUMBER Notes from prior ED visits and [place] Controlled Substance Database  54 year old female presents to the ED with complaint of right hip pain without history of injury.  Patient states that is actually being hurting her for 3 months.  She has been taking over-the-counter medications without any improvement.  Patient reports that she has right hip pain that radiates down to the middle of her right thigh.  Patient has known arthritis bilateral knees.  She has continued to ambulate without any assistance but states it is starting to affect  her work.  X-rays were reassuring and patient was made aware.  She was given Toradol 30 mg IM and a prescription for meloxicam 15 mg 1 daily with food.  She is to follow-up with her PCP if any continued problems. ____________________________________________   FINAL CLINICAL IMPRESSION(S) / ED DIAGNOSES  Final diagnoses:  Sciatica of right side     ED Discharge Orders         Ordered    meloxicam (MOBIC) 15 MG tablet  Daily     02/10/20 1329           Note:  This document was prepared using Dragon voice recognition software and may include unintentional dictation errors.    Tommi Rumps, PA-C 02/10/20 1459    Chesley Noon, MD 02/11/20 [phone]

## 2020-02-10 NOTE — ED Notes (Signed)
See triage note  Presents with pain to right hip  States pain is moving down her leg   Sx's started a few weeks ago

## 2020-02-10 NOTE — ED Triage Notes (Signed)
Pt c/o right thigh pain that radiates down the posterior leg into the knee for several weeks. States she has arthritis in BL knees

## 2020-02-10 NOTE — Discharge Instructions (Signed)
Follow-up with your primary care provider if any continued problems.  X-rays today did not show any fractures or suggestive of a compression fracture.  Begin taking meloxicam 15 mg 1 daily with food starting tomorrow morning.  Take this medication every day.  You may also obtain the over-the-counter Lidoderm patches if added pain medication as needed.  Also the Tylenol will be an additional over-the-counter medication that you can use.  Do not take Aleve, Motrin, or ibuprofen with the medication.  Use ice or heat to your right hip as needed for discomfort.

## 2021-02-05 ENCOUNTER — Other Ambulatory Visit: Payer: Self-pay

## 2021-02-05 ENCOUNTER — Encounter: Payer: Self-pay | Admitting: Emergency Medicine

## 2021-02-05 ENCOUNTER — Emergency Department
Admission: EM | Admit: 2021-02-05 | Discharge: 2021-02-05 | Disposition: A | Discharge status: Home / Self Care | Payer: Self-pay | Attending: Emergency Medicine | Admitting: Emergency Medicine

## 2021-02-05 DIAGNOSIS — M25562 Pain in left knee: Secondary | ICD-10-CM | POA: Insufficient documentation

## 2021-02-05 DIAGNOSIS — M545 Low back pain, unspecified: Secondary | ICD-10-CM | POA: Insufficient documentation

## 2021-02-05 DIAGNOSIS — M25561 Pain in right knee: Secondary | ICD-10-CM | POA: Insufficient documentation

## 2021-02-05 DIAGNOSIS — I10 Essential (primary) hypertension: Secondary | ICD-10-CM | POA: Insufficient documentation

## 2021-02-05 DIAGNOSIS — G8929 Other chronic pain: Secondary | ICD-10-CM | POA: Insufficient documentation

## 2021-02-05 DIAGNOSIS — J45909 Unspecified asthma, uncomplicated: Secondary | ICD-10-CM | POA: Insufficient documentation

## 2021-02-05 DIAGNOSIS — Z7722 Contact with and (suspected) exposure to environmental tobacco smoke (acute) (chronic): Secondary | ICD-10-CM | POA: Insufficient documentation

## 2021-02-05 MED ORDER — KETOROLAC TROMETHAMINE 60 MG/2ML IM SOLN
60.0000 mg | Freq: Once | INTRAMUSCULAR | Status: AC
  Administered 2021-02-05: 60 mg via INTRAMUSCULAR
  Filled 2021-02-05: qty 2

## 2021-02-05 MED ORDER — MELOXICAM 15 MG PO TABS: 15.0000 mg | ORAL_TABLET | Freq: Every day | ORAL | 1 refills | Status: AC

## 2021-02-05 NOTE — ED Triage Notes (Signed)
Pt comes into the ED via POV c/o bilateral knee and back pain.  Pt has known arthritis and states she wants to come in for a "shot".  Denies any injury.

## 2021-02-05 NOTE — Discharge Instructions (Signed)
Follow-up with your regular doctor or orthopedics if not improving in 5 to 7 days.  Return emergency department worsening.  Apply ice to the areas that hurt.  Take meloxicam daily.  You may also take Tylenol.  Do not take ibuprofen, Aleve, or other NSAIDs as it would be a duplicate of the medication you have been prescribed.  This could harm your kidneys if you take too much.

## 2021-02-05 NOTE — ED Provider Notes (Signed)
[facility] Provider Note  ____________________________________________   Event Date/Time   First MD Initiated Contact with Patient 02/05/21 1351     (approximate)  I have reviewed the triage vital signs and the nursing notes.   HISTORY  Chief Complaint Knee Pain and Back Pain    HPI Sydney Gilmore is a 55 y.o. female presents emergency [facility] complaint bilateral knee pain.  Patient states that she also has some low back pain.  States she has arthritis and usually needs a shot to help with her inflammation.  No known allergies.  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  No known injuries.    Past Medical History:  Diagnosis Date  . Arthritis   . Asthma   . Hypertension     There are no problems to display for this patient.   Past Surgical History:  Procedure Laterality Date  . CHOLECYSTECTOMY      Prior to Admission medications   Medication Sig Start Date End Date Taking? Authorizing Provider  meloxicam (MOBIC) 15 MG tablet Take 1 tablet (15 mg total) by mouth daily. 02/05/21 02/05/22  Faythe Ghee, PA-C    Allergies Patient has no known allergies.  Family History  Problem Relation Age of Onset  . Heart failure Father     Social History Social History   Tobacco Use  . Smoking status: Passive Smoke Exposure - Never Smoker  . Smokeless tobacco: Never Used  Substance Use Topics  . Alcohol use: No  . Drug use: No    Review of Systems  Constitutional: No fever/chills Eyes: No visual changes. ENT: No sore throat. Respiratory: Denies cough Genitourinary: Negative for dysuria. Musculoskeletal: Positive for back pain. Skin: Negative for rash. Psychiatric: no mood changes,     ____________________________________________   PHYSICAL EXAM:  VITAL SIGNS: ED Triage Vitals  Enc Vitals Group     BP 02/05/21 1358 (!) 150/87     Pulse Rate 02/05/21 1357 88     Resp 02/05/21 1357 18      Temp 02/05/21 1357 98.7 F (37.1 C)     Temp Source 02/05/21 1357 Oral     SpO2 02/05/21 1357 98 %     Weight 02/05/21 1348 220 lb (99.8 kg)     Height 02/05/21 1348 5\' 6"  (1.676 m)     Head Circumference --      Peak Flow --      Pain Score 02/05/21 1347 3     Pain Loc --      Pain Edu? --      Excl. in [place]? --     Constitutional: Alert and oriented. Well appearing and in no acute distress. Eyes: Conjunctivae are normal.  Head: Atraumatic. Nose: No congestion/rhinnorhea. Mouth/Throat: Mucous membranes are moist.   Neck:  supple no lymphadenopathy noted Cardiovascular: Normal rate, regular rhythm.  Respiratory: Normal respiratory effort.  No retractions,  GU: deferred Musculoskeletal: FROM all extremities, warm and well perfused, crepitus noted in both knees bilaterally, lumbar area is tender in the paravertebral muscles, spine is nontender Neurologic:  Normal speech and language.  Skin:  Skin is warm, dry and intact. No rash noted. Psychiatric: Mood and affect are normal. Speech and behavior are normal.  ____________________________________________   LABS (all labs ordered are listed, but only abnormal results are displayed)  Labs Reviewed - No data to display ____________________________________________   ____________________________________________  RADIOLOGY    ____________________________________________   PROCEDURES  Procedure(s) performed: Toradol 60 mg IM   Procedures    ____________________________________________   INITIAL IMPRESSION / ASSESSMENT AND PLAN / ED COURSE  Pertinent labs & imaging results that were available during my care of the patient were reviewed by me and considered in my medical decision making (see chart for details).   Patient is 55 year old female presents with chronic knee pain and back pain.  See HPI.  Physical exam shows patient appears stable.  Plan of care at this time is to give her a Toradol injection for  pain/inflammation.  She be given a prescription for meloxicam to take daily.  Work note would be given at patient's request.   She is discharged stable condition     Sydney Gilmore was evaluated in Emergency [facility] on 02/05/2021 for the symptoms described in the history of present illness. She was evaluated in the context of the global COVID-19 pandemic, which necessitated consideration that the patient might be at risk for infection with the SARS-CoV-2 virus that causes COVID-19. Institutional protocols and algorithms that pertain to the evaluation of patients at risk for COVID-19 are in a state of rapid change based on information released by regulatory bodies including the CDC and federal and state organizations. These policies and algorithms were followed during the patient's care in the ED.    As part of my medical decision making, I reviewed the following data within the electronic MEDICAL RECORD NUMBER Nursing notes reviewed and incorporated, Old chart reviewed, Notes from prior ED visits and [place] Controlled Substance Database  ____________________________________________   FINAL CLINICAL IMPRESSION(S) / ED DIAGNOSES  Final diagnoses:  Chronic pain of both knees  Acute midline low back pain without sciatica      NEW MEDICATIONS STARTED DURING THIS VISIT:  Current Discharge Medication List       Note:  This document was prepared using Dragon voice recognition software and may include unintentional dictation errors.    Faythe Ghee, PA-C 02/05/21 1415    Shaune Pollack, MD 02/06/21 [phone]

## 2021-04-11 IMAGING — CR DG LUMBAR SPINE 2-3V
1 series · 2 of 2 positions shown · non-contrast
Comparison: None.

CLINICAL DATA: Right hip pain radiating down legs for 3 months

EXAM:
LUMBAR SPINE - 2-3 VIEW

[Series 1: dg lumbar spine 2-3 views · 0.14mm/px · 2 of 2 slices shown]
[im 1/2]
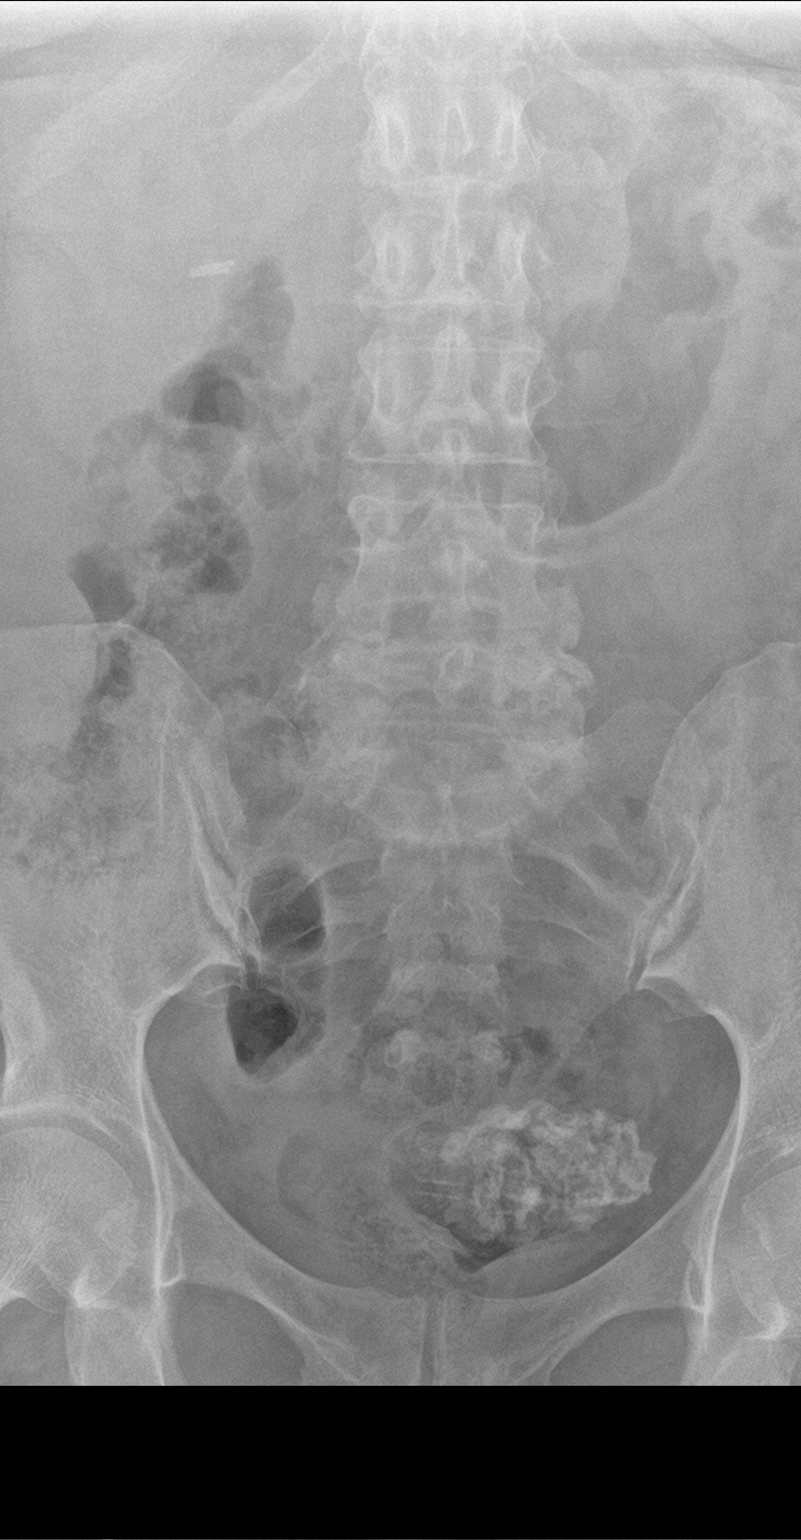
[im 2/2]
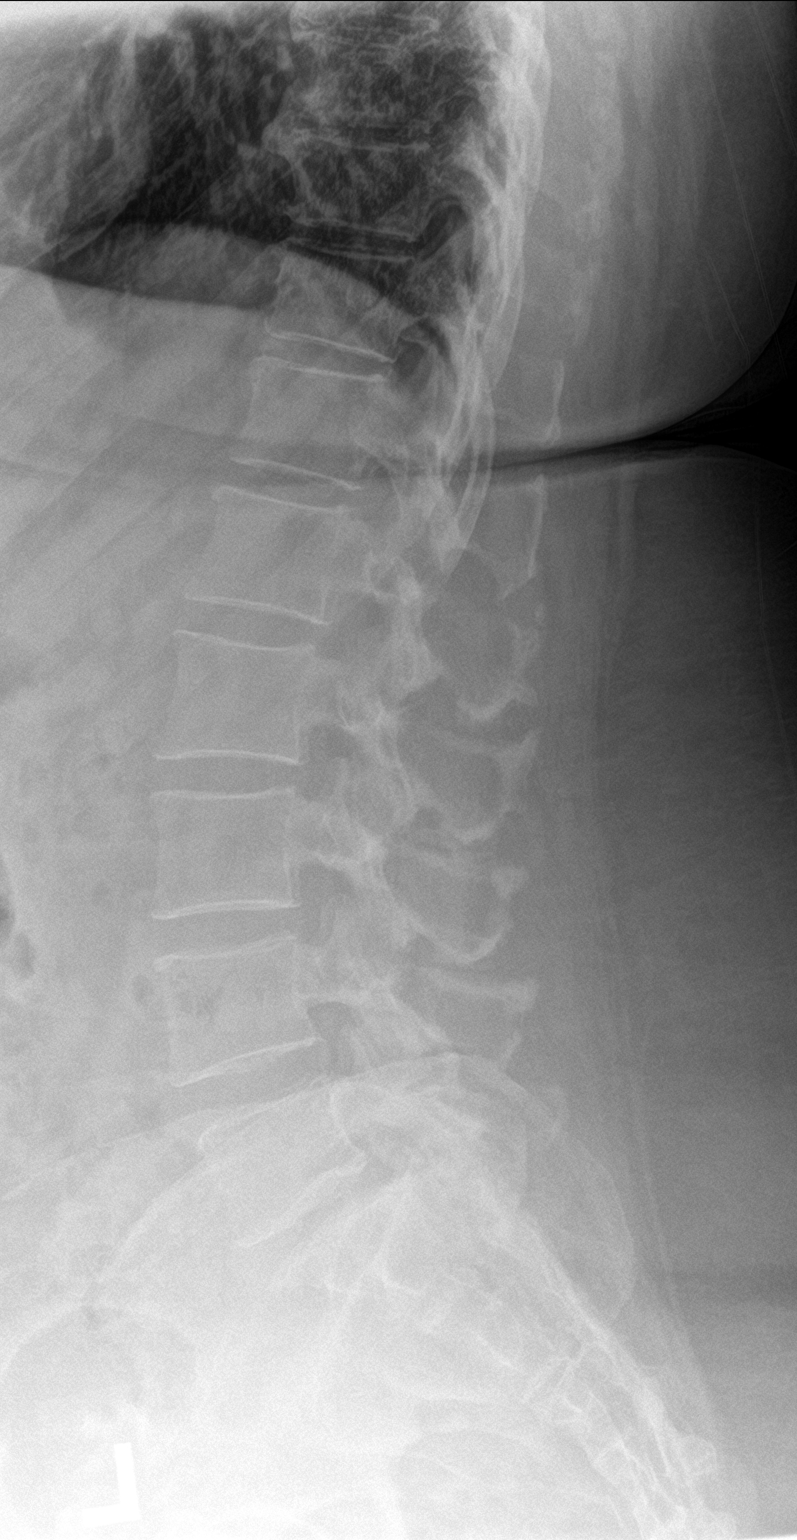

[2 of 2 positions shown; findings below may reference images not displayed]

FINDINGS: There is no evidence of lumbar spine fracture. Alignment is normal.
Intervertebral disc spaces are maintained. Probable calcified
fibroids in the left hemipelvis.
IMPRESSION: No fracture or dislocation of the lumbar spine. Disc spaces and
vertebral body heights are preserved. Lumbar disc and neural
foraminal pathology may be further evaluated by MRI if indicated by
neurologically localizing signs and symptoms.

## 2021-07-11 ENCOUNTER — Emergency Department: Payer: Self-pay

## 2021-07-11 ENCOUNTER — Emergency Department
Admission: EM | Admit: 2021-07-11 | Discharge: 2021-07-11 | Disposition: A | Discharge status: Home / Self Care | Payer: Self-pay | Attending: Student in an Organized Health Care Education/Training Program | Admitting: Student in an Organized Health Care Education/Training Program

## 2021-07-11 ENCOUNTER — Other Ambulatory Visit: Payer: Self-pay

## 2021-07-11 DIAGNOSIS — Z7722 Contact with and (suspected) exposure to environmental tobacco smoke (acute) (chronic): Secondary | ICD-10-CM | POA: Insufficient documentation

## 2021-07-11 DIAGNOSIS — M25512 Pain in left shoulder: Secondary | ICD-10-CM | POA: Insufficient documentation

## 2021-07-11 DIAGNOSIS — J45909 Unspecified asthma, uncomplicated: Secondary | ICD-10-CM | POA: Insufficient documentation

## 2021-07-11 DIAGNOSIS — I10 Essential (primary) hypertension: Secondary | ICD-10-CM | POA: Insufficient documentation

## 2021-07-11 MED ORDER — IBUPROFEN 600 MG PO TABS
600.0000 mg | ORAL_TABLET | Freq: Once | ORAL | Status: AC
  Administered 2021-07-11: 600 mg via ORAL
  Filled 2021-07-11: qty 1

## 2021-07-11 NOTE — ED Triage Notes (Signed)
Pt arrives to ER c/o of L shoulder pain after tripping at the gas station last night and injuring L shoulder. C/o swelling. Able to move L arm. A&O, ambulatory.

## 2021-07-11 NOTE — ED Notes (Signed)
See triage note  Presents s/p trip and fall  States she landed on left shoulder   No deformity noted  Good pulses

## 2021-07-11 NOTE — ED Provider Notes (Signed)
[facility] Emergency Department Provider Note    Event Date/Time   First MD Initiated Contact with Patient 07/11/21 1345     (approximate)  I have reviewed the triage vital signs and the nursing notes.   HISTORY  Chief Complaint Shoulder Injury    HPI Sydney Gilmore is a 55 y.o. female with the below listed past medical history presents to the ER for evaluation of left shoulder injury that occurred while she was from a gas station last night and states that she slipped on ketchup packet.  Did not hit her head.  She fell against a car.  No numbness or tingling.  Has been able to move the shoulder but feels sore and achy today.  Wanted to come get it checked out.   Past Medical History:  Diagnosis Date   Arthritis    Asthma    Hypertension    Family History  Problem Relation Age of Onset   Heart failure Father    Past Surgical History:  Procedure Laterality Date   CHOLECYSTECTOMY     There are no problems to display for this patient.     Prior to Admission medications   Medication Sig Start Date End Date Taking? Authorizing Provider  meloxicam (MOBIC) 15 MG tablet Take 1 tablet (15 mg total) by mouth daily. 02/05/21 02/05/22  Faythe Ghee, PA-C    Allergies Patient has no known allergies.    Social History Social History   Tobacco Use   Smoking status: Passive Smoke Exposure - Never Smoker   Smokeless tobacco: Never  Substance Use Topics   Alcohol use: No   Drug use: No    Review of Systems Patient denies headaches, rhinorrhea, blurry vision, numbness, shortness of breath, chest pain, edema, cough, abdominal pain, nausea, vomiting, diarrhea, dysuria, fevers, rashes or hallucinations unless otherwise stated above in HPI. ____________________________________________   PHYSICAL EXAM:  VITAL SIGNS: Vitals:   07/11/21 1250  BP: (!) 167/95  Pulse: 87  Resp: 18  Temp: 98.4 F (36.9 C)  SpO2: 97%    Constitutional: Alert  and oriented. Well appearing and in no acute distress. Eyes: Conjunctivae are normal.  Head: Atraumatic. Nose: No congestion/rhinnorhea. Mouth/Throat: Mucous membranes are moist.   Neck: Painless ROM.  Cardiovascular:   Good peripheral circulation. Respiratory: Normal respiratory effort.  No retractions.  Gastrointestinal: Soft and nontender.  Musculoskeletal: There is some posterior shoulder tenderness to palpation without step-off deformity.  She does have some discomfort with raising left shoulder above 90 degrees.  No numbness or tingling.  No distal pain or swelling.  No lower extremity tenderness .  No joint effusions. Neurologic:  Normal speech and language. No gross focal neurologic deficits are appreciated.  Skin:  Skin is warm, dry and intact. No rash noted. Psychiatric: Mood and affect are normal. Speech and behavior are normal.  ____________________________________________   LABS (all labs ordered are listed, but only abnormal results are displayed)  No results found for this or any previous visit (from the past 24 hour(s)). ____________________________________________ ____________________________________________  RADIOLOGY  I personally reviewed all radiographic images ordered to evaluate for the above acute complaints and reviewed radiology reports and findings.  These findings were personally discussed with the patient.  Please see medical record for radiology report.  ____________________________________________   PROCEDURES  Procedure(s) performed:  Procedures    Critical Care performed: no ____________________________________________   INITIAL IMPRESSION / ASSESSMENT AND PLAN / ED COURSE  Pertinent labs & imaging results  that were available during my care of the patient were reviewed by me and considered in my medical decision making (see chart for details).   DDX: Musculoskeletal strain, arthritis, dislocation, fracture   Sydney Gilmore is a 55 y.o.  who presents to the ED with acute left shoulder pain as described above.  X-rays reassuring.  Neurovascularly intact.  Will place in sling for comfort and concern for possible ligamentous injury otherwise stable and appropriate for outpatient follow-up.  The patient was evaluated in Emergency Department today for the symptoms described in the history of present illness. He/she was evaluated in the context of the global COVID-19 pandemic, which necessitated consideration that the patient might be at risk for infection with the SARS-CoV-2 virus that causes COVID-19. Institutional protocols and algorithms that pertain to the evaluation of patients at risk for COVID-19 are in a state of rapid change based on information released by regulatory bodies including the CDC and federal and state organizations. These policies and algorithms were followed during the patient's care in the ED.       ____________________________________________   FINAL CLINICAL IMPRESSION(S) / ED DIAGNOSES  Final diagnoses:  Acute pain of left shoulder      NEW MEDICATIONS STARTED DURING THIS VISIT:  New Prescriptions   No medications on file     Note:  This document was prepared using Dragon voice recognition software and may include unintentional dictation errors.     Willy Eddy, MD 07/11/21 [phone]

## 2022-03-20 ENCOUNTER — Other Ambulatory Visit: Payer: Self-pay

## 2022-03-20 ENCOUNTER — Encounter: Payer: Self-pay | Admitting: Emergency Medicine

## 2022-03-20 ENCOUNTER — Emergency Department
Admission: EM | Admit: 2022-03-20 | Discharge: 2022-03-20 | Disposition: A | Discharge status: Home / Self Care | Payer: Self-pay | Attending: Emergency Medicine | Admitting: Emergency Medicine

## 2022-03-20 DIAGNOSIS — H5789 Other specified disorders of eye and adnexa: Secondary | ICD-10-CM

## 2022-03-20 DIAGNOSIS — H538 Other visual disturbances: Secondary | ICD-10-CM | POA: Insufficient documentation

## 2022-03-20 NOTE — ED Provider Notes (Signed)
? ?[facility] ?Provider Note ? ? ? None  ?  (approximate) ? ? ?History  ? ?Eye Problem ? ? ?HPI ? ?Sydney Gilmore is a 56 y.o. female who presents to the ED for evaluation of Eye Problem ?  ?Patient presents to the ED, accompanied by her son, for evaluation of left eye irritation last night.  She reports her left eye felt irritated and red last night, so she used a few drops of Visine eyedrops.  She reports sleeping well and feeling better this morning.  She presents to the ED to make sure she is going to be all right.  Reports normal visual acuity throughout her visual fields and her eye feels better now and has no complaints.  She is requesting a work note. ? ?Physical Exam  ? ?Triage Vital Signs: ?ED Triage Vitals  ?Enc Vitals Group  ?   BP 03/20/22 0726 (!) 160/102  ?   Pulse Rate 03/20/22 0726 88  ?   Resp 03/20/22 0726 18  ?   Temp 03/20/22 0726 (!) 97.5 ?F (36.4 ?C)  ?   Temp Source 03/20/22 0726 Oral  ?   SpO2 03/20/22 0726 97 %  ?   Weight 03/20/22 0724 235 lb (106.6 kg)  ?   Height 03/20/22 0724 5\' 6"  (1.676 m)  ?   Head Circumference --   ?   Peak Flow --   ?   Pain Score 03/20/22 0724 0  ?   Pain Loc --   ?   Pain Edu? --   ?   Excl. in GC? --   ? ? ?Most recent vital signs: ?Vitals:  ? 03/20/22 0726  ?BP: (!) 160/102  ?Pulse: 88  ?Resp: 18  ?Temp: (!) 97.5 ?F (36.4 ?C)  ?SpO2: 97%  ? ? ?General: Awake, no distress.  Ambulatory with normal gait.  Pleasant and conversational. ?CV:  Good peripheral perfusion.  ?Resp:  Normal effort.  ?Abd:  No distention.  ?MSK:  No deformity noted.  ?Neuro:  No focal deficits appreciated. Cranial nerves II through XII intact ?5/5 strength and sensation in all 4 extremities ?Other:  EOM intact without signs of entrapment, nystagmus.  No conjunctival irritation or erythema noted.  Pupils are midline and PERRL. ? ? ?ED Results / Procedures / Treatments  ? ?Labs ?(all labs ordered are listed, but only abnormal results are displayed) ?Labs Reviewed - No  data to display ? ?EKG ? ? ?RADIOLOGY ? ? ?Official radiology report(s): ?No results found. ? ?PROCEDURES and INTERVENTIONS: ? ?Procedures ? ?Medications - No data to display ? ? ?IMPRESSION / MDM / ASSESSMENT AND PLAN / ED COURSE  ?I reviewed the triage vital signs and the nursing notes. ? ?56 year old female presents to the ED after an episode of eye irritation, since resolved and with a normal examination now.  She is suitable for outpatient management.  Provide her with ophthalmology follow-up information as needed for symptoms were to recur.  We discussed appropriate return precautions for the ED.  Seen indications for CNS imaging, less likely corneal abrasion considering the resolution of her symptoms.  May have had a tiny abrasion that has since resolved or healed.  Doubt iritis, no evidence of open globe. ? ?  ? ? ?FINAL CLINICAL IMPRESSION(S) / ED DIAGNOSES  ? ?Final diagnoses:  ?Eye irritation  ? ? ? ?Rx / DC Orders  ? ?ED Discharge Orders   ? ? None  ? ?  ? ? ? ?  Note:  This document was prepared using Dragon voice recognition software and may include unintentional dictation errors. ?  ?Delton Prairie, MD ?03/20/22 0800 ? ?

## 2022-03-20 NOTE — ED Triage Notes (Signed)
Pt reports last pm her left eye started itching and she rubbed it a few times and now her eye is red and it feels irritated.  ?

## 2022-03-20 NOTE — Discharge Instructions (Signed)
If your eye pain returns, then please reach out to the eye doctor/ophthalmologist attached.  If you continue to feel well you do not need to reach out to them. ?

## 2022-03-20 NOTE — ED Notes (Signed)
Pt verbalizes understanding of d/c instructions and follow up. 

## 2024-05-16 ENCOUNTER — Other Ambulatory Visit: Payer: Self-pay

## 2024-07-30 ENCOUNTER — Emergency Department

## 2024-07-30 ENCOUNTER — Emergency Department
Admission: EM | Admit: 2024-07-30 | Discharge: 2024-07-30 | Discharge status: Against Medical Advice | Attending: Emergency Medicine | Admitting: Emergency Medicine

## 2024-07-30 ENCOUNTER — Other Ambulatory Visit: Payer: Self-pay

## 2024-07-30 DIAGNOSIS — Z5329 Procedure and treatment not carried out because of patient's decision for other reasons: Secondary | ICD-10-CM | POA: Diagnosis not present

## 2024-07-30 DIAGNOSIS — L03012 Cellulitis of left finger: Secondary | ICD-10-CM | POA: Insufficient documentation

## 2024-07-30 DIAGNOSIS — M7989 Other specified soft tissue disorders: Secondary | ICD-10-CM | POA: Diagnosis not present

## 2024-07-30 DIAGNOSIS — Z7722 Contact with and (suspected) exposure to environmental tobacco smoke (acute) (chronic): Secondary | ICD-10-CM | POA: Insufficient documentation

## 2024-07-30 DIAGNOSIS — J45909 Unspecified asthma, uncomplicated: Secondary | ICD-10-CM | POA: Insufficient documentation

## 2024-07-30 DIAGNOSIS — I10 Essential (primary) hypertension: Secondary | ICD-10-CM | POA: Diagnosis not present

## 2024-07-30 LAB — CBC WITH DIFFERENTIAL/PLATELET
Abs Immature Granulocytes: 0.04 K/uL (ref 0.00–0.07)
Basophils Absolute: 0 K/uL (ref 0.0–0.1)
Basophils Relative: 0 %
Eosinophils Absolute: 0.3 K/uL (ref 0.0–0.5)
Eosinophils Relative: 3 %
HCT: 46.7 % — ABNORMAL HIGH (ref 36.0–46.0)
Hemoglobin: 14.8 g/dL (ref 12.0–15.0)
Immature Granulocytes: 0 %
Lymphocytes Relative: 20 %
Lymphs Abs: 2.1 K/uL (ref 0.7–4.0)
MCH: 27.7 pg (ref 26.0–34.0)
MCHC: 31.7 g/dL (ref 30.0–36.0)
MCV: 87.5 fL (ref 80.0–100.0)
Monocytes Absolute: 0.7 K/uL (ref 0.1–1.0)
Monocytes Relative: 7 %
Neutro Abs: 7 K/uL (ref 1.7–7.7)
Neutrophils Relative %: 70 %
Platelets: 247 K/uL (ref 150–400)
RBC: 5.34 MIL/uL — ABNORMAL HIGH (ref 3.87–5.11)
RDW: 13.1 % (ref 11.5–15.5)
WBC: 10.2 K/uL (ref 4.0–10.5)
nRBC: 0 % (ref 0.0–0.2)

## 2024-07-30 LAB — BASIC METABOLIC PANEL WITH GFR
Anion gap: 12 (ref 5–15)
BUN: 16 mg/dL (ref 6–20)
CO2: 24 mmol/L (ref 22–32)
Calcium: 8.9 mg/dL (ref 8.9–10.3)
Chloride: 101 mmol/L (ref 98–111)
Creatinine, Ser: 0.73 mg/dL (ref 0.44–1.00)
GFR, Estimated: >60 mL/min (ref >60)
Glucose, Bld: 110 mg/dL — ABNORMAL HIGH (ref 70–99)
Potassium: 3.9 mmol/L (ref 3.5–5.1)
Sodium: 137 mmol/L (ref 135–145)

## 2024-07-30 LAB — C-REACTIVE PROTEIN: CRP: 2.8 mg/dL — ABNORMAL HIGH (ref <1.0)

## 2024-07-30 LAB — SEDIMENTATION RATE: Sed Rate: 6 mm/h (ref 0–30)

## 2024-07-30 MED ORDER — DOXYCYCLINE MONOHYDRATE 100 MG PO TABS
100.0000 mg | ORAL_TABLET | Freq: Two times a day (BID) | ORAL | 0 refills | Status: AC
Start: 2024-07-30 — End: 2024-08-06

## 2024-07-30 MED ORDER — CEFAZOLIN SODIUM-DEXTROSE 2-4 GM/100ML-% IV SOLN
2.0000 g | Freq: Three times a day (TID) | INTRAVENOUS | Status: DC
  Administered 2024-07-30: 2 g via INTRAVENOUS
  Filled 2024-07-30: qty 100

## 2024-07-30 MED ORDER — KETOROLAC TROMETHAMINE 15 MG/ML IJ SOLN
15.0000 mg | Freq: Once | INTRAMUSCULAR | Status: AC
  Administered 2024-07-30: 15 mg via INTRAVENOUS
  Filled 2024-07-30: qty 1

## 2024-07-30 MED ORDER — CEPHALEXIN 500 MG PO CAPS: 500.0000 mg | ORAL_CAPSULE | Freq: Four times a day (QID) | ORAL | 0 refills | Status: AC

## 2024-07-30 NOTE — Consult Note (Signed)
 ORTHOPAEDIC CONSULTATION  REQUESTING PHYSICIAN: Viviann Pastor, MD  Chief Complaint:   Left hand pain  History of Present Illness: Sydney Gilmore is a 58 y.o. female who presents with left hand pain, swelling, erythema over the volar aspect of her middle finger and palm.  She reports that she first noted some hand pain 3 to 4 days ago but did not notice significant swelling or any erythema until yesterday.  The patient does not recall any type of puncture wound but does endorse that she tried to catch something with her hand at work and it is possible that she suffered some type of superficial abrasion from this.  No fevers or chills. The Emergency Department has consulted orthopedic surgery to rule out flexor tenosynovitis.  They have started the patient on IV antibiotics.  The patient also endorses that since starting the IV antibiotics earlier this morning that her pain, swelling, erythema have improved.  WBC- 10.2, ESR- 6, CRP- pending  Past Medical History:  Diagnosis Date   Arthritis    Asthma    Hypertension    Past Surgical History:  Procedure Laterality Date   CHOLECYSTECTOMY     Social History   Socioeconomic History   Marital status: Married    Spouse name: Not on file   Number of children: Not on file   Years of education: Not on file   Highest education level: Not on file  Occupational History   Not on file  Tobacco Use   Smoking status: Passive Smoke Exposure - Never Smoker   Smokeless tobacco: Never  Vaping Use   Vaping status: Never Used  Substance and Sexual Activity   Alcohol use: No   Drug use: No   Sexual activity: Not on file  Other Topics Concern   Not on file  Social History Narrative   ** Merged History Encounter **       Social Drivers of Health   Financial Resource Strain: Not on file  Food Insecurity: Not on file  Transportation Needs: Not on file  Physical Activity:  Not on file  Stress: Not on file  Social Connections: Not on file   Family History  Problem Relation Age of Onset   Heart failure Father    No Known Allergies Prior to Admission medications   Not on File   Recent Labs    07/30/24 0732  WBC 10.2  HGB 14.8  HCT 46.7*  PLT 247  K 3.9  CL 101  CO2 24  BUN 16  CREATININE 0.73  GLUCOSE 110*  CALCIUM 8.9   DG Hand Complete Left Result Date: 07/30/2024 EXAM: 3 or more VIEW(S) XRAY OF THE LEFT HAND 07/30/2024 07:27:00 AM COMPARISON: None available. CLINICAL HISTORY: Injury swelling. FINDINGS: BONES AND JOINTS: No acute fracture. No focal osseous lesion. No joint dislocation. SOFT TISSUES: Soft tissue swelling of the third digit. IMPRESSION: 1. Soft tissue swelling of the third digit, likely related to the reported injury. Electronically signed by: Waddell Calk MD 07/30/2024 07:52 AM EDT RP Workstation: [id number]    Positive ROS: All other systems have been reviewed and were otherwise negative with the exception of those mentioned in the HPI and as above.  Physical Exam: BP (!) 150/90 (BP Location: Left Arm)   Pulse 88   Temp 98.2 F (36.8 C) (Oral)   Resp 18   Ht 5' 6 (1.676 m)   Wt 108.9 kg   SpO2 100%   BMI 38.74 kg/m  General:  Alert, no  acute distress Psychiatric:  Patient is competent for consent with normal mood and affect    Orthopedic Exam:  Left Upper Extremity: There is swelling primarily over the volar aspect of the middle finger at the level of the proximal phalanx.  The swelling is primarily volar some extension dorsally but only at the level of the proximal phalanx.  Distally there is minimal swelling noted. there is mild erythema at this level and slightly more proximal over the palm.  she has no pain to palpation along the flexor tendon sheath with exception to over the level of the proximal phalanx.  She holds the finger slightly flexed at the PIP.  She has minimal discomfort with passive extension of the  digit.  Sensation is intact to light touch.  The finger is warm and well-perfused.  Imaging:  Radiographs of the left hand do not demonstrate any bony abnormalities, degenerative changes, erosive changes, fractures, dislocations, or foreign bodies. Soft tissue swelling about the middle finger noted.  Assessment/Plan: Left hand cellulitis vs early stage FTS Favor cellulitis at this stage as patient's exam is unconvincing for deeper infection - Recommend hospitalist admission for IV antibiotics - Will monitor response to antibiotics over the next 24 hours - Recommend strict elevation and compression of the left upper extremity, intermittent icing - Discussed the possible need for surgical intervention should her examination worsen - Will continue to follow exam closely  Sydney CANDIE Barrack, MD Orthopaedic Surgery [facility]  ADDENDUM: On completion of this consultation and discussion with the patient, the ED informed me that the patient has chosen to leave the hospital AGAINST MEDICAL ADVICE.  During my conversation with the patient she endorsed that she felt like her condition was improving and that it would be difficult for her to stay overnight as she needs to go to work tomorrow.  She was also very resistant to the idea of having any type of surgery.  I explained to the patient the importance of staying for IV antibiotics and monitoring.  I discussed that if she were to leave, her infection could worsen and spread deeper and/or to other digits.  I explained that it could lead to destruction of her flexor tendon sheath, and even the tendons themselves.  I discussed the risks of losing her finger(s), sepsis, significant permanent dysfunction.

## 2024-07-30 NOTE — ED Notes (Signed)
 See triage note  Presents with pain and swelling to left hand  States she is not sure if she hit it on metal or was bitten . She noticed the area 3 days ago  Denies any fever  Redness and swelling noted

## 2024-07-30 NOTE — ED Triage Notes (Signed)
 Pt to ED for L hand swelling, unsure if she hit her hand. Noticed the pain and swelling 3 days ago. Pt has been applying alcohol. No cuts/bites/injuries noted.

## 2024-07-30 NOTE — Discharge Instructions (Addendum)
 You were evaluated by the hand surgeon who recommends that you be admitted to the hospital for IV antibiotics.  Your decision to leave today is AGAINST MEDICAL ADVICE.  As we discussed, your infection could get significantly worse, leading to (but not limited to) amputation, permanent disability, or death.  It is highly recommended that he stay for further treatments.  Remember that you may turn it anytime if you change your mind.  In the meantime take the antibiotics.  Please follow-up with hand surgery in the clinic as soon as possible, preferably this week.

## 2024-07-30 NOTE — ED Provider Notes (Signed)
 [facility] Provider Note    Event Date/Time   First MD Initiated Contact with Patient 07/30/24 [phone]     (approximate)   History   Hand Injury   HPI  Sydney Gilmore is a 58 y.o. female with no reported past medical history but reports that she does not see a primary care provider who presents today for evaluation of left third finger swelling.  Patient reports that on Friday she began to notice some discomfort in that finger along the volar aspect and attributed it to trying to catch something that fell off of a table.  She also is unsure if she got a splinter or got bit by something.  She reports that yesterday she began to notice some redness and swelling along the proximal phalanx and noticed that the redness had spread into her palm today.  No fevers or chills.  She is unable to fully straighten her finger.  There are no active problems to display for this patient.         Physical Exam   Triage Vital Signs: ED Triage Vitals  Encounter Vitals Group     BP 07/30/24 0705 (!) 168/108     Girls Systolic BP Percentile --      Girls Diastolic BP Percentile --      Boys Systolic BP Percentile --      Boys Diastolic BP Percentile --      Pulse Rate 07/30/24 0705 94     Resp 07/30/24 0705 20     Temp 07/30/24 0706 98.2 F (36.8 C)     Temp Source 07/30/24 0705 Oral     SpO2 07/30/24 0705 100 %     Weight 07/30/24 0706 240 lb (108.9 kg)     Height 07/30/24 0706 5' 6 (1.676 m)     Head Circumference --      Peak Flow --      Pain Score 07/30/24 0704 5     Pain Loc --      Pain Education --      Exclude from Growth Chart --     Most recent vital signs: Vitals:   07/30/24 0706 07/30/24 0950  BP:  (!) 150/90  Pulse:  88  Resp:  18  Temp: 98.2 F (36.8 C)   SpO2:  100%    Physical Exam Vitals and nursing note reviewed.  Constitutional:      General: Awake and alert. No acute distress.    Appearance: Normal appearance.   HENT:      Head: Normocephalic and atraumatic.     Mouth: Mucous membranes are moist.  Eyes:     General: PERRL. Normal EOMs        Right eye: No discharge.        Left eye: No discharge.     Conjunctiva/sclera: Conjunctivae normal.  Cardiovascular:     Rate and Rhythm: Normal rate and regular rhythm.     Pulses: Normal pulses.  Pulmonary:     Effort: Pulmonary effort is normal. No respiratory distress.     Breath sounds: Normal breath sounds.  Abdominal:     Abdomen is soft. There is no abdominal tenderness. No rebound or guarding. No distention. Musculoskeletal:        General: No swelling. Normal range of motion.     Cervical back: Normal range of motion and neck supple.  Left hand: There is swelling and tenderness primarily along the volar aspect of the finger,  specifically at the proximal phalanx.  Patient is holding her hand in passive flexion with mild discomfort with passive extension.  There is fusiform swelling of the finger, though area of maximal swelling is over the volar proximal phalanx.  She has faint erythema extending into the upper aspect of the palm, just beneath her third MCP.  No lymphangitis.  No tenderness in the palm.  Sensation intact light touch throughout.  Hand and finger are warm and well-perfused. Skin:    General: Skin is warm and dry.     Capillary Refill: Capillary refill takes less than 2 seconds.     Findings: No rash.  Neurological:     Mental Status: The patient is awake and alert.      ED Results / Procedures / Treatments   Labs (all labs ordered are listed, but only abnormal results are displayed) Labs Reviewed  BASIC METABOLIC PANEL WITH GFR - Abnormal; Notable for the following components:      Result Value   Glucose, Bld 110 (*)    All other components within normal limits  CBC WITH DIFFERENTIAL/PLATELET - Abnormal; Notable for the following components:   RBC 5.34 (*)    HCT 46.7 (*)    All other components within normal limits  SEDIMENTATION  RATE  C-REACTIVE PROTEIN     EKG     RADIOLOGY I independently reviewed and interpreted imaging and agree with radiologists findings.     PROCEDURES:  Critical Care performed:   Procedures   MEDICATIONS ORDERED IN ED: Medications  ceFAZolin  (ANCEF ) IVPB 2g/100 mL premix (0 g Intravenous Stopped 07/30/24 1108)  ketorolac  (TORADOL ) 15 MG/ML injection 15 mg (15 mg Intravenous Given 07/30/24 0845)     IMPRESSION / MDM / ASSESSMENT AND PLAN / ED COURSE  I reviewed the triage vital signs and the nursing notes.   Differential diagnosis includes, but is not limited to, flexor tenosynovitis, abscess, cellulitis.  Patient is awake and alert, hemodynamically stable and afebrile.  She has fusiform swelling, tenderness along the flexor aspect, though primarily at the proximal phalanx with minimal tenderness more distally, and is holding her finger in passive flexion.  She has very minimal discomfort with passive extension.  She has 3 out of 4 [place] signs.  Further workup is indicated.  IV was established and labs were obtained and including a x-ray of her hand.  X-ray of her hand reveals soft tissue swelling about the third digit, no osseous abnormalities.  Labs reveal white blood cell count of 10, ESR of 6, CRP is pending.  I consulted Dr. Ezra with orthopedic surgery given concern for early flexor tenosynovitis.  Patient was given Toradol  for pain control as well as IV Ancef .  Dr. Ezra came to evaluate the patient and feels that the exam is more consistent with cellulitis.  However, he does recommend admission to the hospital for IV antibiotics, monitoring for 24 hours, elevation and compression of the left upper extremity, and possible operative intervention depending on tomorrow's exam.  However, patient adamantly does not want to stay.  She reports that she has a disabled son at home.  She also continues to tell me that this is happen to other digits in her body, which all got  better on its own.  I emphasized to her that this is concerning for flexor tenosynovitis which could get significantly worse and results in amputation versus permanent disability, or even death, or other poor outcomes without proper management.  Patient demonstrates understanding and is  able to repeat back to me the risks, however she still refuses to stay.  The risks of leaving against medical advice without further workup were explained to the patient.  The patient demonstrated good understanding and was able to verbalize in their own words the risks of leaving up to, and including, death or permanent disability.  While I do not agree with their decision, their thought process is goal directed.  Their speech is appropriate and mental status is normal The patient has not demonstrated any SI.  The decision appears to be consistent with what limited knowledge I have of the patient's values.  The patient has capacity to make the decision to leave agaist medical advice.  The patient was informed that we are always happy to care for them and that they are always welcome to return at any time.  The patient understands the warning signs and symptoms that should prompt them to seek immediate emergency care.  I did advise that she follow-up closely with hand surgery and the appropriate follow-up information was provided.  I also advised Dr. Ezra that the patient is leaving AGAINST MEDICAL ADVICE.  She was started on oral antibiotics but advised that this will likely not be sufficient.  Patient's presentation is most consistent with acute presentation with potential threat to life or bodily function.   Clinical Course as of 07/30/24 1328  Tue Jul 30, 2024  0927 Discussed with Dr. Ezra, he will come to evaluate the patient [JP]  1156 Ortho currently at bedside [JP]  1218 Dr. Ezra with orthopedics recommends admission for IV antibiotics and reevaluation in the morning.  However, patient refuses to stay.  I  also had a long conversation with the patient regarding our concerns and the risks of departure including worsening infection, need for amputation, permanent disability, and death.  Patient reports that if it gets worse she will go to [facility] for a second opinion. [JP]  1219 Discussed with patient that her departure is AGAINST MEDICAL ADVICE.  The risks of leaving against medical advice without treatment were explained to the patient.  The patient demonstrated good understanding and was able to verbalize in their own words the risks of leaving up to, and including, death or permanent disability.  While I do not agree with their decision, their thought process is goal directed.  Their speech is appropriate and mental status is normal The patient has not demonstrated any SI.  The decision appears to be consistent with what limited knowledge I have of the patient's values.  The patient has capacity to make the decision to leave agaist medical advice.  The patient was informed that we are always happy to care for them and that they are always welcome to return at any time.  The patient understands the warning signs and symptoms that should prompt them to seek immediate emergency care  [JP]    Clinical Course User Index [JP] Dondra Rhett E, PA-C     FINAL CLINICAL IMPRESSION(S) / ED DIAGNOSES   Final diagnoses:  Cellulitis of finger of left hand     Rx / DC Orders   ED Discharge Orders          Ordered    cephALEXin  (KEFLEX ) 500 MG capsule  4 times daily        07/30/24 1223    doxycycline  (ADOXA) 100 MG tablet  2 times daily        07/30/24 1223  Note:  This document was prepared using Dragon voice recognition software and may include unintentional dictation errors.   Marlon Vonruden E, PA-C 07/30/24 1328    Viviann Pastor, MD 07/31/24 1141

## 2024-09-16 ENCOUNTER — Emergency Department
Admission: EM | Admit: 2024-09-16 | Discharge: 2024-09-16 | Disposition: A | Discharge status: Home / Self Care | Attending: Emergency Medicine | Admitting: Emergency Medicine

## 2024-09-16 ENCOUNTER — Other Ambulatory Visit: Payer: Self-pay

## 2024-09-16 DIAGNOSIS — R112 Nausea with vomiting, unspecified: Secondary | ICD-10-CM | POA: Insufficient documentation

## 2024-09-16 MED ORDER — ONDANSETRON 4 MG PO TBDP: 4.0000 mg | ORAL_TABLET | Freq: Three times a day (TID) | ORAL | 0 refills | Status: AC | PRN

## 2024-09-16 MED ORDER — ONDANSETRON 4 MG PO TBDP
4.0000 mg | ORAL_TABLET | Freq: Once | ORAL | Status: AC
  Administered 2024-09-16: 4 mg via ORAL
  Filled 2024-09-16: qty 1

## 2024-09-16 NOTE — ED Notes (Signed)
 Pt refusing lab work at this time. RN notified MD. Pt sts  I dont need any blood work. I just need something for this cold and then be on my way.

## 2024-09-16 NOTE — ED Provider Notes (Signed)
 [facility] Provider Note    Event Date/Time   First MD Initiated Contact with Patient 09/16/24 [phone]     (approximate)   History   Emesis   HPI  Sydney Gilmore is a 58 y.o. female  who presents to the emergency department today because of concern for nausea and vomiting. Patient has had cold like symptoms (sneezing, congestion) for the past couple of days. 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 This morning however she developed nausea and vomiting. Has now vomited a few times today. Denies any associated abdominal pain. Denies any bloody emesis. The patient did have some diarrhea. States she does not want any blood work drawn.       Physical Exam   Triage Vital Signs: ED Triage Vitals  Encounter Vitals Group     BP 09/16/24 0736 (!) 165/101     Girls Systolic BP Percentile --      Girls Diastolic BP Percentile --      Boys Systolic BP Percentile --      Boys Diastolic BP Percentile --      Pulse Rate 09/16/24 0734 97     Resp 09/16/24 0734 18     Temp 09/16/24 0738 97.6 F (36.4 C)     Temp Source 09/16/24 0734 Oral     SpO2 09/16/24 0734 98 %     Weight --      Height --      Head Circumference --      Peak Flow --      Pain Score 09/16/24 0734 3     Pain Loc --      Pain Education --      Exclude from Growth Chart --     Most recent vital signs: Vitals:   09/16/24 0736 09/16/24 0738  BP: (!) 165/101   Pulse: 99   Resp: 18   Temp:  97.6 F (36.4 C)  SpO2: 98%    General: Awake, alert, oriented. CV:  Good peripheral perfusion. Regular rate and rhythm. Resp:  Normal effort. Lungs clear. Abd:  No distention. Non tender.  ED Results / Procedures / Treatments   Labs (all labs ordered are listed, but only abnormal results are displayed) None    EKG  None   RADIOLOGY None   PROCEDURES:  Critical Care performed: No    MEDICATIONS ORDERED IN ED: Medications - No data to display   IMPRESSION / MDM /  ASSESSMENT AND PLAN / ED COURSE  I reviewed the triage vital signs and the nursing notes.                              Differential diagnosis includes, but is not limited to, gastroenteritis, pancreatitis, hepatitis, SBO  Patient's presentation is most consistent with acute presentation with potential threat to life or bodily function.   Patient presented to the emergency department today because concerns for nausea and vomiting.  On exam patient without any abdominal tenderness.  Patient declined blood work.  I did discuss with patient that would limit our ability to diagnose more concerning pathology.  She again declined blood work.  Will treat nausea.  Did encourage patient return for any worsening symptoms.   FINAL CLINICAL IMPRESSION(S) / ED DIAGNOSES   Final diagnoses:  Nausea and vomiting, unspecified vomiting type     Note:  This document was prepared using Dragon voice recognition software  and may include unintentional dictation errors.    Floy Roberts, MD 09/16/24 [phone]

## 2024-09-16 NOTE — ED Triage Notes (Addendum)
 Pt to ED via POV from home. Pt ambulatory to triage. Pt reports has been fighting a cold and this morning started vomiting this morning. Some diarrhea. Denies abd pain. Pt reports sick contacts at work. Pt drank some water before triage. Axillary temp obtained.
